# Patient Record
Sex: Female | Born: 1945
Health system: Southern US, Community
[De-identification: ages and names within clinical notes are randomized; demographics above are authoritative.]

## PROBLEM LIST (undated history)

## (undated) DIAGNOSIS — I10 Essential (primary) hypertension: Secondary | ICD-10-CM

## (undated) DIAGNOSIS — K219 Gastro-esophageal reflux disease without esophagitis: Secondary | ICD-10-CM

## (undated) DIAGNOSIS — M1990 Unspecified osteoarthritis, unspecified site: Secondary | ICD-10-CM

## (undated) DIAGNOSIS — E039 Hypothyroidism, unspecified: Secondary | ICD-10-CM

## (undated) DIAGNOSIS — C801 Malignant (primary) neoplasm, unspecified: Secondary | ICD-10-CM

## (undated) HISTORY — PX: ECTOPIC PREGNANCY SURGERY: SHX613

---

## 2017-08-18 ENCOUNTER — Observation Stay
Admission: EM | Admit: 2017-08-18 | Discharge: 2017-08-19 | Disposition: A | Discharge status: Home / Self Care | Payer: Medicare HMO | Attending: Internal Medicine | Admitting: Internal Medicine

## 2017-08-18 ENCOUNTER — Emergency Department: Payer: Medicare HMO

## 2017-08-18 DIAGNOSIS — N3000 Acute cystitis without hematuria: Secondary | ICD-10-CM | POA: Insufficient documentation

## 2017-08-18 DIAGNOSIS — I6782 Cerebral ischemia: Secondary | ICD-10-CM | POA: Diagnosis not present

## 2017-08-18 DIAGNOSIS — R42 Dizziness and giddiness: Secondary | ICD-10-CM | POA: Insufficient documentation

## 2017-08-18 DIAGNOSIS — I10 Essential (primary) hypertension: Secondary | ICD-10-CM | POA: Diagnosis not present

## 2017-08-18 DIAGNOSIS — E876 Hypokalemia: Secondary | ICD-10-CM | POA: Insufficient documentation

## 2017-08-18 DIAGNOSIS — F17210 Nicotine dependence, cigarettes, uncomplicated: Secondary | ICD-10-CM | POA: Insufficient documentation

## 2017-08-18 DIAGNOSIS — Z720 Tobacco use: Secondary | ICD-10-CM | POA: Diagnosis not present

## 2017-08-18 DIAGNOSIS — I161 Hypertensive emergency: Secondary | ICD-10-CM | POA: Diagnosis not present

## 2017-08-18 LAB — BASIC METABOLIC PANEL
ANION GAP: 10 (ref 5–15)
Anion gap: 10 (ref 5–15)
BUN: 12 mg/dL (ref 6–20)
BUN: 11 mg/dL (ref 6–20)
CALCIUM: 9.7 mg/dL (ref 8.9–10.3)
CALCIUM: 9.3 mg/dL (ref 8.9–10.3)
CHLORIDE: 102 mmol/L (ref 101–111)
CHLORIDE: 104 mmol/L (ref 101–111)
CO2: 25 mmol/L (ref 22–32)
CO2: 25 mmol/L (ref 22–32)
CREATININE: 0.97 mg/dL (ref 0.44–1.00)
Creatinine, Ser: 1.15 mg/dL — ABNORMAL HIGH (ref 0.44–1.00)
GFR calc non Af Amer: 46 mL/min — ABNORMAL LOW (ref >60)
GFR calc non Af Amer: 57 mL/min — ABNORMAL LOW (ref >60)
GFR, EST AFRICAN AMERICAN: 53 mL/min — AB (ref >60)
GLUCOSE: 90 mg/dL (ref 65–99)
Glucose, Bld: 151 mg/dL — ABNORMAL HIGH (ref 65–99)
Potassium: 3.7 mmol/L (ref 3.5–5.1)
Potassium: 3.4 mmol/L — ABNORMAL LOW (ref 3.5–5.1)
SODIUM: 137 mmol/L (ref 135–145)
Sodium: 139 mmol/L (ref 135–145)

## 2017-08-18 LAB — URINALYSIS, COMPLETE (UACMP) WITH MICROSCOPIC
BILIRUBIN URINE: NEGATIVE
GLUCOSE, UA: NEGATIVE mg/dL
KETONES UR: NEGATIVE mg/dL
Nitrite: NEGATIVE
PH: 7 (ref 5.0–8.0)
PROTEIN: NEGATIVE mg/dL
Specific Gravity, Urine: 1.004 — ABNORMAL LOW (ref 1.005–1.030)

## 2017-08-18 LAB — CBC
HCT: 37.8 % (ref 35.0–47.0)
Hemoglobin: 12.9 g/dL (ref 12.0–16.0)
MCH: 33.9 pg (ref 26.0–34.0)
MCHC: 34.1 g/dL (ref 32.0–36.0)
MCV: 99.3 fL (ref 80.0–100.0)
Platelets: 228 10*3/uL (ref 150–440)
RBC: 3.81 MIL/uL (ref 3.80–5.20)
RDW: 14.3 % (ref 11.5–14.5)
WBC: 6.8 10*3/uL (ref 3.6–11.0)

## 2017-08-18 LAB — TROPONIN I: Troponin I: <0.03 ng/mL (ref <0.03)

## 2017-08-18 MED ORDER — HYDROCHLOROTHIAZIDE 25 MG PO TABS
25.0000 mg | ORAL_TABLET | Freq: Every day | ORAL | Status: DC
  Administered 2017-08-18: 25 mg via ORAL
  Filled 2017-08-18 (×2): qty 1

## 2017-08-18 MED ORDER — HYDRALAZINE HCL 20 MG/ML IJ SOLN
5.0000 mg | Freq: Once | INTRAMUSCULAR | Status: AC
  Administered 2017-08-18: 5 mg via INTRAVENOUS
  Filled 2017-08-18: qty 1

## 2017-08-18 MED ORDER — SODIUM CHLORIDE 0.9 % IV BOLUS (SEPSIS)
500.0000 mL | Freq: Once | INTRAVENOUS | Status: AC
  Administered 2017-08-18: 500 mL via INTRAVENOUS

## 2017-08-18 MED ORDER — ENOXAPARIN SODIUM 40 MG/0.4ML ~~LOC~~ SOLN
40.0000 mg | SUBCUTANEOUS | Status: DC
  Administered 2017-08-18: 40 mg via SUBCUTANEOUS
  Filled 2017-08-18: qty 0.4

## 2017-08-18 MED ORDER — CEPHALEXIN 500 MG PO CAPS
500.0000 mg | ORAL_CAPSULE | Freq: Once | ORAL | Status: AC
  Administered 2017-08-18: 500 mg via ORAL
  Filled 2017-08-18: qty 1

## 2017-08-18 MED ORDER — ONDANSETRON HCL 4 MG PO TABS: 4.0000 mg | ORAL_TABLET | Freq: Four times a day (QID) | ORAL | Status: DC | PRN

## 2017-08-18 MED ORDER — ACETAMINOPHEN 325 MG PO TABS: 650.0000 mg | ORAL_TABLET | Freq: Four times a day (QID) | ORAL | Status: DC | PRN

## 2017-08-18 MED ORDER — LABETALOL HCL 5 MG/ML IV SOLN
10.0000 mg | INTRAVENOUS | Status: DC | PRN
  Administered 2017-08-18: 10 mg via INTRAVENOUS

## 2017-08-18 MED ORDER — LABETALOL HCL 5 MG/ML IV SOLN
INTRAVENOUS | Status: AC
  Filled 2017-08-18: qty 4

## 2017-08-18 MED ORDER — HYDRALAZINE HCL 20 MG/ML IJ SOLN: 10.0000 mg | Freq: Four times a day (QID) | INTRAMUSCULAR | Status: DC | PRN

## 2017-08-18 MED ORDER — NICOTINE 21 MG/24HR TD PT24
21.0000 mg | MEDICATED_PATCH | Freq: Every day | TRANSDERMAL | Status: DC
  Administered 2017-08-18: 21 mg via TRANSDERMAL
  Filled 2017-08-18 (×2): qty 1

## 2017-08-18 MED ORDER — LISINOPRIL 10 MG PO TABS
10.0000 mg | ORAL_TABLET | Freq: Every day | ORAL | Status: DC
  Filled 2017-08-18: qty 1

## 2017-08-18 MED ORDER — ACETAMINOPHEN 650 MG RE SUPP: 650.0000 mg | Freq: Four times a day (QID) | RECTAL | Status: DC | PRN

## 2017-08-18 MED ORDER — POTASSIUM CHLORIDE CRYS ER 20 MEQ PO TBCR
40.0000 meq | EXTENDED_RELEASE_TABLET | Freq: Once | ORAL | Status: AC
  Administered 2017-08-18: 40 meq via ORAL
  Filled 2017-08-18: qty 2

## 2017-08-18 MED ORDER — LISINOPRIL 10 MG PO TABS
10.0000 mg | ORAL_TABLET | Freq: Once | ORAL | Status: AC
  Administered 2017-08-18: 10 mg via ORAL
  Filled 2017-08-18: qty 1

## 2017-08-18 MED ORDER — CLONIDINE HCL 0.1 MG PO TABS
0.1000 mg | ORAL_TABLET | Freq: Two times a day (BID) | ORAL | Status: DC
  Administered 2017-08-18: 0.1 mg via ORAL
  Filled 2017-08-18: qty 1

## 2017-08-18 MED ORDER — ONDANSETRON HCL 4 MG/2ML IJ SOLN: 4.0000 mg | Freq: Four times a day (QID) | INTRAMUSCULAR | Status: DC | PRN

## 2017-08-18 NOTE — ED Provider Notes (Signed)
[facility] Emergency Department Provider Note  ____________________________________________  Time seen: Approximately 4:29 PM  I have reviewed the triage vital signs and the nursing notes.   HISTORY  Chief Complaint Hypertension and Dizziness   HPI Susan Medina is a 71 y.o. female no significant past medical history who presents for evaluation of dizziness. Patient has not seen a doctor in over 8 years. She denies having any medical problems. Since yesterday she's been having episodes of dizziness and feeling like she is going to pass out. These episodes happened both at rest, standing, and with exertion. They have happened several times a day for 2 days. No headache, no visual changes, no facial droop, no slurred speech, no difficulty finding words, no unilateral weakness or numbness, no chest pain or palpitations, no shortness of breath, no back pain or abdominal pain, no swelling of her lower extremities. She denies personal or family history of strokes or heart attacks.  History reviewed. No pertinent past medical history.  There are no active problems to display for this patient.   History reviewed. No pertinent surgical history.  Prior to Admission medications   Not on File    Allergies Patient has no known allergies.  No family history on file.  Social History Social History  Substance Use Topics  . Smoking status: Not on file  . Smokeless tobacco: Not on file  . Alcohol use Not on file    Review of Systems  Constitutional: Negative for fever. + near syncope Eyes: Negative for visual changes. ENT: Negative for sore throat. Neck: No neck pain  Cardiovascular: Negative for chest pain. Respiratory: Negative for shortness of breath. Gastrointestinal: Negative for abdominal pain, vomiting or diarrhea. Genitourinary: Negative for dysuria. Musculoskeletal: Negative for back pain. Skin: Negative for rash. Neurological: Negative for  headaches, weakness or numbness. Psych: No SI or HI  ____________________________________________   PHYSICAL EXAM:  VITAL SIGNS: ED Triage Vitals  Enc Vitals Group     BP 08/18/17 1210 (!) 175/107     Pulse Rate 08/18/17 1210 85     Resp 08/18/17 1210 16     Temp 08/18/17 1210 99.1 F (37.3 C)     Temp Source 08/18/17 1210 Oral     SpO2 08/18/17 1210 95 %     Weight 08/18/17 1209 160 lb (72.6 kg)     Height 08/18/17 1209 5\' 3"  (1.6 m)     Head Circumference --      Peak Flow --      Pain Score --      Pain Loc --      Pain Edu? --      Excl. in [place]? --     Constitutional: Alert and oriented. Well appearing and in no apparent distress. HEENT:      Head: Normocephalic and atraumatic.         Eyes: Conjunctivae are normal. Sclera is non-icteric.       Mouth/Throat: Mucous membranes are moist.       Neck: Supple with no signs of meningismus. Cardiovascular: Regular rate and rhythm. No murmurs, gallops, or rubs. 2+ symmetrical distal pulses are present in all extremities. No JVD. Respiratory: Normal respiratory effort. Lungs are clear to auscultation bilaterally. No wheezes, crackles, or rhonchi.  Gastrointestinal: Soft, non tender, and non distended with positive bowel sounds. No rebound or guarding. Musculoskeletal: Nontender with normal range of motion in all extremities. No edema, cyanosis, or erythema of extremities. Neurologic: Normal speech and language.  A & O x3, PERRL, no nystagmus, CN II-XII intact, motor testing reveals good tone and bulk throughout. There is no evidence of pronator drift or dysmetria. Muscle strength is 5/5 throughout. Sensory examination is intact. Gait is normal. Skin: Skin is warm, dry and intact. No rash noted. Psychiatric: Mood and affect are normal. Speech and behavior are normal.  ____________________________________________   LABS (all labs ordered are listed, but only abnormal results are displayed)  Labs Reviewed  BASIC METABOLIC PANEL  - Abnormal; Notable for the following:       Result Value   Glucose, Bld 151 (*)    Creatinine, Ser 1.15 (*)    GFR calc non Af Amer 46 (*)    GFR calc Af Amer 53 (*)    All other components within normal limits  URINALYSIS, COMPLETE (UACMP) WITH MICROSCOPIC - Abnormal; Notable for the following:    Color, Urine YELLOW (*)    APPearance HAZY (*)    Specific Gravity, Urine 1.004 (*)    Hgb urine dipstick SMALL (*)    Leukocytes, UA SMALL (*)    Bacteria, UA RARE (*)    Squamous Epithelial / LPF 0-5 (*)    All other components within normal limits  BASIC METABOLIC PANEL - Abnormal; Notable for the following:    Potassium 3.4 (*)    GFR calc non Af Amer 57 (*)    All other components within normal limits  URINE CULTURE  CBC  TROPONIN I  CBG MONITORING, ED   ____________________________________________  EKG  ED ECG REPORT I, Rudene Re, the attending physician, personally viewed and interpreted this ECG.  Normal sinus rhythm, rate of 74, normal intervals, normal axis, no ST elevations or depressions, T-wave inversions in lateral leads. no prior for comparison. ____________________________________________  RADIOLOGY  Head CT:  1. No acute intracranial process identified. 2. Moderate cerebral atrophy with chronic small vessel ischemic disease. ____________________________________________   PROCEDURES  Procedure(s) performed: None Procedures Critical Care performed:  None ____________________________________________   INITIAL IMPRESSION / ASSESSMENT AND PLAN / ED COURSE  71 y.o. female no significant past medical history who presents for evaluation of several episodes of near syncope over the course of the last 2 days. Patient is well-appearing, neurologically intact, has no chest pain or shortness of breath. She also has no headache. Her EKG shows T-wave inversions in lateral leads however there is no prior for comparison. Patient's vital signs show BP of  175/107. labs showing a creatinine of 1.15 with a GFR of 53. No prior labs available for comparison. Also slightly elevated glucose of 151 however this is not a fasting BMP. Troponin is negative. No evidence of anemia.head CT with no evidence of stroke or hemorrhage. We'll start patient on lisinopril in the emergency department for elevated blood pressure, we'll give IV fluids and recheck kidney function.    _________________________ 6:52 PM on 08/18/2017 -----------------------------------------  kidney function improved after IV fluids. Blood pressure remains elevated after 10 mg of by mouth lisinopril 5 mg of IV hydralazine. Patient continues to have episodes of lightheadedness with ambulation. CT head is negative. UA concerning for a urinary tract infection for which patient was started on Keflex. Due to concerns for hypertensive emergency patient will be admitted to the hospitalist service.   As part of my medical decision making, I reviewed the following data within the [place] History obtained from family, Nursing notes reviewed and incorporated, Labs reviewed , EKG interpreted , Discussed with admitting physician ,  Notes from prior ED visits and [place] Controlled Substance Database    Pertinent labs & imaging results that were available during my care of the patient were reviewed by me and considered in my medical decision making (see chart for details).    ____________________________________________   FINAL CLINICAL IMPRESSION(S) / ED DIAGNOSES  Final diagnoses:  Hypertensive emergency  Acute cystitis without hematuria      NEW MEDICATIONS STARTED DURING THIS VISIT:  New Prescriptions   No medications on file     Note:  This document was prepared using Dragon voice recognition software and may include unintentional dictation errors.    Rudene Re, MD 08/18/17 [phone]

## 2017-08-18 NOTE — H&P (Signed)
[facility] [place] NAME: Susan Medina    MR#:  [id number]  DATE OF BIRTH:  1946-08-30  DATE OF ADMISSION:  08/18/2017  PRIMARY CARE PHYSICIAN: Patient, No Pcp Per   REQUESTING/REFERRING PHYSICIAN: Dr. Rudene Re  CHIEF COMPLAINT:   Chief Complaint  Patient presents with  . Hypertension  . Dizziness    HISTORY OF PRESENT ILLNESS:  Susan Medina  is a 71 y.o. female with no known past medical history who presents to the hospital due to dizziness. Patient says that she's been feeling dizzy when trying to stand up over the past 2 days. She presented to the ER and was noted to be significantly hypertensive with systolic blood pressures greater than 409-811 and also diastolic greater than 914. Patient received some oral antihypertensives including lisinopril and HCTZ but her blood pressure has not improved. She remains symptomatic upon ambulation with significant dizziness. Her CT head is negative for any acute CVA. She also complains of some mild blurry vision. Given her accelerated hypertension and her being symptomatic with it hospitalist services were contacted further treatment and evaluation. Patient denies any chest pains, shortness of breath, nausea, vomiting, abdominal pain, fever, chills or any other associated symptoms presently.  PAST MEDICAL HISTORY:  History reviewed. No pertinent past medical history.  PAST SURGICAL HISTORY:  History reviewed. No pertinent surgical history.  SOCIAL HISTORY:   Social History  Substance Use Topics  . Smoking status: Current Every Day Smoker    Packs/day: 0.50    Years: 50.00    Types: Cigarettes  . Smokeless tobacco: Not on file  . Alcohol use No    FAMILY HISTORY:   Family History  Problem Relation Age of Onset  . Diabetes Mother     DRUG ALLERGIES:  No Known Allergies  REVIEW OF SYSTEMS:   Review of Systems  Constitutional: Negative for chills and fever.  HENT:  Negative for congestion and tinnitus.   Eyes: Positive for blurred vision. Negative for double vision.  Respiratory: Negative for cough, shortness of breath and wheezing.   Cardiovascular: Negative for chest pain, orthopnea and PND.  Gastrointestinal: Negative for abdominal pain, diarrhea, nausea and vomiting.  Genitourinary: Negative for dysuria and hematuria.  Neurological: Positive for dizziness. Negative for sensory change and focal weakness.  All other systems reviewed and are negative.   MEDICATIONS AT HOME:   Prior to Admission medications   Not on File      VITAL SIGNS:  Blood pressure (!) 200/98, pulse 72, temperature 99.1 F (37.3 C), temperature source Oral, resp. rate (!) 21, height 5\' 3"  (1.6 m), weight 72.6 kg (160 lb), SpO2 98 %.  PHYSICAL EXAMINATION:  Physical Exam  GENERAL:  71 y.o.-year-old patient lying in bed in no acute distress.  EYES: Pupils equal, round, reactive to light and accommodation. No scleral icterus. Extraocular muscles intact.  HEENT: Head atraumatic, normocephalic. Oropharynx and nasopharynx clear. No oropharyngeal erythema, moist oral mucosa  NECK:  Supple, no jugular venous distention. No thyroid enlargement, no tenderness.  LUNGS: Normal breath sounds bilaterally, no wheezing, rales, rhonchi. No use of accessory muscles of respiration.  CARDIOVASCULAR: S1, S2 RRR. No murmurs, rubs, gallops, clicks.  ABDOMEN: Soft, nontender, nondistended. Bowel sounds present. No organomegaly or mass.  EXTREMITIES: No pedal edema, cyanosis, or clubbing. + 2 pedal & radial pulses b/l.   NEUROLOGIC: Cranial nerves II through XII are intact. No focal Motor or sensory deficits appreciated b/l PSYCHIATRIC: The patient is alert  and oriented x 3.   SKIN: No obvious rash, lesion, or ulcer.   LABORATORY PANEL:   CBC  Recent Labs Lab 08/18/17 1216  WBC 6.8  HGB 12.9  HCT 37.8  PLT 228    ------------------------------------------------------------------------------------------------------------------  Chemistries   Recent Labs Lab 08/18/17 1717  NA 139  K 3.4*  CL 104  CO2 25  GLUCOSE 90  BUN 11  CREATININE 0.97  CALCIUM 9.3   ------------------------------------------------------------------------------------------------------------------  Cardiac Enzymes  Recent Labs Lab 08/18/17 1215  TROPONINI <0.03   ------------------------------------------------------------------------------------------------------------------  RADIOLOGY:  Ct Head Wo Contrast  Result Date: 08/18/2017 CLINICAL DATA:  Initial evaluation for persistent vertigo. EXAM: CT HEAD WITHOUT CONTRAST TECHNIQUE: Contiguous axial images were obtained from the base of the skull through the vertex without intravenous contrast. COMPARISON:  None available. FINDINGS: Brain: Moderate cerebral atrophy with chronic small vessel ischemic disease. No acute intracranial hemorrhage. No evidence for acute large vessel territory infarct. No mass lesion, midline shift or mass effect. No hydrocephalus. No extra-axial fluid collection. Vascular: No hyperdense vessel. Scattered vascular calcifications noted within the carotid siphons. Skull: Scalp soft tissues and calvarium within normal limits. Sinuses/Orbits: Globes normal soft tissues unremarkable. Visualized paranasal sinuses are clear. Visualize mastoids are well pneumatized and clear. Other: None. IMPRESSION: 1. No acute intracranial process identified. 2. Moderate cerebral atrophy with chronic small vessel ischemic disease. Electronically Signed   By: Jeannine Boga M.D.   On: 08/18/2017 16:14     IMPRESSION AND PLAN:   71 year old female with past medical history of tobacco abuse who presents to the hospital due to dizziness and blurry vision and noted to have accelerated hypertension.  1. Malignant/accelerated hypertension - the cause of patient's  blurry vision, dizziness. -CT head is negative for acute CVA. I will place the patient on some oral lisinopril, HCTZ and also clonidine. -I will also add some IV hydralazine, labetalol. Follow blood pressure.  2. Tobacco abuse-we'll place on nicotine patch.  3. Hypokalemia-we'll give oral potassium supplements and repeat level in the morning.    All the records are reviewed and case discussed with ED provider. Management plans discussed with the patient, family and they are in agreement.  CODE STATUS: Full code  TOTAL TIME TAKING CARE OF THIS PATIENT: 40 minutes.    Henreitta Leber M.D on 08/18/2017 at 7:13 PM  Between 7am to 6pm - Pager - [phone]  After 6pm go to [email or web address] - password [email or web address] [facility]  Office  [phone]  CC: Primary care physician; Patient, No Pcp Per

## 2017-08-18 NOTE — ED Triage Notes (Signed)
Pt came to ED via pov c/o near syncopal episodes, reports believes blood pressure is elevated, has never been diagnosed with htn. BP 176/108 in triage.

## 2017-08-19 DIAGNOSIS — E876 Hypokalemia: Secondary | ICD-10-CM | POA: Diagnosis not present

## 2017-08-19 DIAGNOSIS — Z720 Tobacco use: Secondary | ICD-10-CM | POA: Diagnosis not present

## 2017-08-19 DIAGNOSIS — I10 Essential (primary) hypertension: Secondary | ICD-10-CM | POA: Diagnosis not present

## 2017-08-19 LAB — CBC
HCT: 34.5 % — ABNORMAL LOW (ref 35.0–47.0)
Hemoglobin: 11.9 g/dL — ABNORMAL LOW (ref 12.0–16.0)
MCH: 34.2 pg — ABNORMAL HIGH (ref 26.0–34.0)
MCHC: 34.6 g/dL (ref 32.0–36.0)
MCV: 99.1 fL (ref 80.0–100.0)
PLATELETS: 200 10*3/uL (ref 150–440)
RBC: 3.48 MIL/uL — AB (ref 3.80–5.20)
RDW: 14.1 % (ref 11.5–14.5)
WBC: 5.4 10*3/uL (ref 3.6–11.0)

## 2017-08-19 LAB — BASIC METABOLIC PANEL
Anion gap: 10 (ref 5–15)
BUN: 13 mg/dL (ref 6–20)
CALCIUM: 9.5 mg/dL (ref 8.9–10.3)
CO2: 23 mmol/L (ref 22–32)
CREATININE: 1.22 mg/dL — AB (ref 0.44–1.00)
Chloride: 105 mmol/L (ref 101–111)
GFR calc non Af Amer: 43 mL/min — ABNORMAL LOW (ref >60)
GFR, EST AFRICAN AMERICAN: 50 mL/min — AB (ref >60)
Glucose, Bld: 88 mg/dL (ref 65–99)
Potassium: 4 mmol/L (ref 3.5–5.1)
SODIUM: 138 mmol/L (ref 135–145)

## 2017-08-19 MED ORDER — LISINOPRIL 10 MG PO TABS: 10.0000 mg | ORAL_TABLET | Freq: Every day | ORAL | 0 refills | Status: AC

## 2017-08-19 MED ORDER — ASPIRIN 81 MG PO TBEC: 81.0000 mg | DELAYED_RELEASE_TABLET | Freq: Every day | ORAL | 0 refills | Status: AC

## 2017-08-19 MED ORDER — SODIUM CHLORIDE 0.9 % IV BOLUS (SEPSIS): 250.0000 mL | Freq: Once | INTRAVENOUS | Status: DC

## 2017-08-19 MED ORDER — HYDROCHLOROTHIAZIDE 25 MG PO TABS: 25.0000 mg | ORAL_TABLET | Freq: Every day | ORAL | 1 refills | Status: AC

## 2017-08-19 MED ORDER — ASPIRIN EC 81 MG PO TBEC
81.0000 mg | DELAYED_RELEASE_TABLET | Freq: Every day | ORAL | Status: DC
  Administered 2017-08-19: 81 mg via ORAL
  Filled 2017-08-19: qty 1

## 2017-08-19 NOTE — Discharge Instructions (Signed)
Pt instructed to get PCP in the area  Keep log of blood pressure at home.   Pt will need to pick up BP monitor from pharmacy.  Blood pressure meds should not be taken if TOP NUMBER is below 120  How to Take Your Blood Pressure Blood pressure is a measurement of how strongly your blood is pressing against the walls of your arteries. Arteries are blood vessels that carry blood from your heart throughout your body. Your health care provider takes your blood pressure at each office visit. You can also take your own blood pressure at home with a blood pressure machine. You may need to take your own blood pressure:  To confirm a diagnosis of high blood pressure (hypertension).  To monitor your blood pressure over time.  To make sure your blood pressure medicine is working.  Supplies needed: To take your blood pressure, you will need a blood pressure machine. You can buy a blood pressure machine, or blood pressure monitor, at most drugstores or online. There are several types of home blood pressure monitors. When choosing one, consider the following:  Choose a monitor that has an arm cuff.  Choose a monitor that wraps snugly around your upper arm. You should be able to fit only one finger between your arm and the cuff.  Do not choose a monitor that measures your blood pressure from your wrist or finger.  Your health care provider can suggest a reliable monitor that will meet your needs. How to prepare To get the most accurate reading, avoid the following for 30 minutes before you check your blood pressure:  Drinking caffeine.  Drinking alcohol.  Eating.  Smoking.  Exercising.  Five minutes before you check your blood pressure:  Empty your bladder.  Sit quietly without talking in a dining chair, rather than in a soft couch or armchair.  How to take your blood pressure To check your blood pressure, follow the instructions in the manual that came with your blood pressure monitor.  If you have a digital blood pressure monitor, the instructions may be as follows: 1. Sit up straight. 2. Place your feet on the floor. Do not cross your ankles or legs. 3. Rest your left arm at the level of your heart on a table or desk or on the arm of a chair. 4. Pull up your shirt sleeve. 5. Wrap the blood pressure cuff around the upper part of your left arm, 1 inch (2.5 cm) above your elbow. It is best to wrap the cuff around bare skin. 6. Fit the cuff snugly around your arm. You should be able to place only one finger between the cuff and your arm. 7. Position the cord inside the groove of your elbow. 8. Press the power button. 9. Sit quietly while the cuff inflates and deflates. 10. Read the digital reading on the monitor screen and write it down (record it). 11. Wait 2-3 minutes, then repeat the steps, starting at step 1.  What does my blood pressure reading mean? A blood pressure reading consists of a higher number over a lower number. Ideally, your blood pressure should be below 120/80. The first ("top") number is called the systolic pressure. It is a measure of the pressure in your arteries as your heart beats. The second ("bottom") number is called the diastolic pressure. It is a measure of the pressure in your arteries as the heart relaxes. Blood pressure is classified into four stages. The following are the stages for adults  who do not have a short-term serious illness or a chronic condition. Systolic pressure and diastolic pressure are measured in a unit called mm Hg. Normal  Systolic pressure: below 818.  Diastolic pressure: below 80. Elevated  Systolic pressure: 590-931.  Diastolic pressure: below 80. Hypertension stage 1  Systolic pressure: 121-624.  Diastolic pressure: 46-95. Hypertension stage 2  Systolic pressure: 072 or above.  Diastolic pressure: 90 or above. You can have prehypertension or hypertension even if only the systolic or only the diastolic number  in your reading is higher than normal. Follow these instructions at home:  Check your blood pressure as often as recommended by your health care provider.  Take your monitor to the next appointment with your health care provider to make sure: ? That you are using it correctly. ? That it provides accurate readings.  Be sure you understand what your goal blood pressure numbers are.  Tell your health care provider if you are having any side effects from blood pressure medicine. Contact a health care provider if:  Your blood pressure is consistently high. Get help right away if:  Your systolic blood pressure is higher than 180.  Your diastolic blood pressure is higher than 110. This information is not intended to replace advice given to you by your health care provider. Make sure you discuss any questions you have with your health care provider. Document Released: 03/24/2016 Document Revised: 06/06/2016 Document Reviewed: 03/24/2016 Elsevier Interactive Patient Education  [employer].  or if BOTTOM NUMBER is below 60.

## 2017-08-19 NOTE — Discharge Summary (Signed)
[facility] NAME: Susan Medina    MR#:  [id number]  DATE OF BIRTH:  24-Jul-1946  DATE OF ADMISSION:  08/18/2017 ADMITTING PHYSICIAN: Henreitta Leber, MD  DATE OF DISCHARGE: 08/19/17  PRIMARY CARE PHYSICIAN: Patient, No Pcp Per    ADMISSION DIAGNOSIS:  Acute cystitis without hematuria [N30.00] Hypertensive emergency [I16.1]  DISCHARGE DIAGNOSIS:  Malignant HTN-new Tobacco abuse  SECONDARY DIAGNOSIS:  History reviewed. No pertinent past medical history.  HOSPITAL COURSE:   71 year old female with past medical history of tobacco abuse who presents to the hospital due to dizziness and blurry vision and noted to have accelerated hypertension.  1. Malignant/accelerated hypertension - the cause of patient's blurry vision, dizziness. -CT head is negative for acute CVA. -bp better on  oral lisinopril, HCTZ .  IV hydralazine prn -ASA 81 mg  2. Tobacco abuse-we'll place on nicotine patch.  3. Hypokalemia- repleted  Overall improved Advised to keep log of BP at home D/c home alter today Pt advised to get PCP in the area  CONSULTS OBTAINED:    DRUG ALLERGIES:  No Known Allergies  DISCHARGE MEDICATIONS:   Current Discharge Medication List    START taking these medications   Details  aspirin EC 81 MG EC tablet Take 1 tablet (81 mg total) by mouth daily. Qty: 30 tablet, Refills: 0    hydrochlorothiazide (HYDRODIURIL) 25 MG tablet Take 1 tablet (25 mg total) by mouth daily. Qty: 30 tablet, Refills: 1    lisinopril (PRINIVIL,ZESTRIL) 10 MG tablet Take 1 tablet (10 mg total) by mouth daily. Qty: 30 tablet, Refills: 0        If you experience worsening of your admission symptoms, develop shortness of breath, life threatening emergency, suicidal or homicidal thoughts you must seek medical attention immediately by calling 911 or calling your MD immediately  if symptoms less severe.  You Must read complete  instructions/literature along with all the possible adverse reactions/side effects for all the Medicines you take and that have been prescribed to you. Take any new Medicines after you have completely understood and accept all the possible adverse reactions/side effects.   Please note  You were cared for by a hospitalist during your hospital stay. If you have any questions about your discharge medications or the care you received while you were in the hospital after you are discharged, you can call the unit and asked to speak with the hospitalist on call if the hospitalist that took care of you is not available. Once you are discharged, your primary care physician will handle any further medical issues. Please note that NO REFILLS for any discharge medications will be authorized once you are discharged, as it is imperative that you return to your primary care physician (or establish a relationship with a primary care physician if you do not have one) for your aftercare needs so that they can reassess your need for medications and monitor your lab values. Today   SUBJECTIVE   Feels better  VITAL SIGNS:  Blood pressure (!) 163/89, pulse 60, temperature 98 F (36.7 C), temperature source Oral, resp. rate 16, height 5\' 3"  (1.6 m), weight 72.6 kg (160 lb), SpO2 97 %.  I/O:   Intake/Output Summary (Last 24 hours) at 08/19/17 0838 Last data filed at 08/19/17 0648  Gross per 24 hour  Intake                0 ml  Output  200 ml  Net             -200 ml    PHYSICAL EXAMINATION:  GENERAL:  71 y.o.-year-old patient lying in the bed with no acute distress.  EYES: Pupils equal, round, reactive to light and accommodation. No scleral icterus. Extraocular muscles intact.  HEENT: Head atraumatic, normocephalic. Oropharynx and nasopharynx clear.  NECK:  Supple, no jugular venous distention. No thyroid enlargement, no tenderness.  LUNGS: Normal breath sounds bilaterally, no wheezing,  rales,rhonchi or crepitation. No use of accessory muscles of respiration.  CARDIOVASCULAR: S1, S2 normal. No murmurs, rubs, or gallops.  ABDOMEN: Soft, non-tender, non-distended. Bowel sounds present. No organomegaly or mass.  EXTREMITIES: No pedal edema, cyanosis, or clubbing.  NEUROLOGIC: Cranial nerves II through XII are intact. Muscle strength 5/5 in all extremities. Sensation intact. Gait not checked.  PSYCHIATRIC: The patient is alert and oriented x 3.  SKIN: No obvious rash, lesion, or ulcer.   DATA REVIEW:   CBC   Recent Labs Lab 08/19/17 0358  WBC 5.4  HGB 11.9*  HCT 34.5*  PLT 200    Chemistries   Recent Labs Lab 08/19/17 0358  NA 138  K 4.0  CL 105  CO2 23  GLUCOSE 88  BUN 13  CREATININE 1.22*  CALCIUM 9.5    Microbiology Results   No results found for this or any previous visit (from the past 240 hour(s)).  RADIOLOGY:  Ct Head Wo Contrast  Result Date: 08/18/2017 CLINICAL DATA:  Initial evaluation for persistent vertigo. EXAM: CT HEAD WITHOUT CONTRAST TECHNIQUE: Contiguous axial images were obtained from the base of the skull through the vertex without intravenous contrast. COMPARISON:  None available. FINDINGS: Brain: Moderate cerebral atrophy with chronic small vessel ischemic disease. No acute intracranial hemorrhage. No evidence for acute large vessel territory infarct. No mass lesion, midline shift or mass effect. No hydrocephalus. No extra-axial fluid collection. Vascular: No hyperdense vessel. Scattered vascular calcifications noted within the carotid siphons. Skull: Scalp soft tissues and calvarium within normal limits. Sinuses/Orbits: Globes normal soft tissues unremarkable. Visualized paranasal sinuses are clear. Visualize mastoids are well pneumatized and clear. Other: None. IMPRESSION: 1. No acute intracranial process identified. 2. Moderate cerebral atrophy with chronic small vessel ischemic disease. Electronically Signed   By: Jeannine Boga  M.D.   On: 08/18/2017 16:14     Management plans discussed with the patient, family and they are in agreement.  CODE STATUS:     Code Status Orders        Start     Ordered   08/18/17 1941  Full code  Continuous     08/18/17 1940    Code Status History    Date Active Date Inactive Code Status Order ID Comments User Context   This patient has a current code status but no historical code status.      TOTAL TIME TAKING CARE OF THIS PATIENT: 40 minutes.    Jennetta Flood M.D on 08/19/2017 at 8:38 AM  Between 7am to 6pm - Pager - [phone] After 6pm go to [email or web address] - password [email or web address] [facility]  Office  [phone]  CC: Primary care physician; Patient, No Pcp Per

## 2017-08-19 NOTE — Care Management CC44 (Signed)
Condition Code 44 Documentation Completed  Patient Details  Name: EDIT RICCIARDELLI MRN: [id number] Date of Birth: November 19, 1945   Condition Code 44 given:  Yes Patient signature on Condition Code 44 notice:  Yes Documentation of 2 MD's agreement:  Yes Code 44 added to claim:  Yes    Kobie Whidby A, RN 08/19/2017, 2:08 PM

## 2017-08-19 NOTE — Care Management Obs Status (Signed)
[facility] NOTIFICATION   Patient Details  Name: Susan Medina MRN: [id number] Date of Birth: May 12, 1946   Medicare Observation Status Notification Given:  Yes    Devona Holmes A, RN 08/19/2017, 2:08 PM

## 2017-08-21 LAB — URINE CULTURE: Culture: >=100000 — AB

## 2017-08-24 DIAGNOSIS — I10 Essential (primary) hypertension: Secondary | ICD-10-CM | POA: Diagnosis not present

## 2017-08-24 DIAGNOSIS — R300 Dysuria: Secondary | ICD-10-CM | POA: Diagnosis not present

## 2017-09-18 DIAGNOSIS — M545 Low back pain, unspecified: Secondary | ICD-10-CM | POA: Insufficient documentation

## 2017-09-18 DIAGNOSIS — Z13220 Encounter for screening for lipoid disorders: Secondary | ICD-10-CM | POA: Diagnosis not present

## 2017-09-18 DIAGNOSIS — G8929 Other chronic pain: Secondary | ICD-10-CM | POA: Insufficient documentation

## 2017-09-18 DIAGNOSIS — Z5181 Encounter for therapeutic drug level monitoring: Secondary | ICD-10-CM | POA: Diagnosis not present

## 2017-09-18 DIAGNOSIS — I10 Essential (primary) hypertension: Secondary | ICD-10-CM | POA: Diagnosis not present

## 2017-09-18 DIAGNOSIS — Z8639 Personal history of other endocrine, nutritional and metabolic disease: Secondary | ICD-10-CM | POA: Diagnosis not present

## 2017-10-12 ENCOUNTER — Emergency Department: Payer: Medicare HMO

## 2017-10-12 ENCOUNTER — Encounter: Payer: Self-pay | Admitting: Emergency Medicine

## 2017-10-12 ENCOUNTER — Emergency Department
Admission: EM | Admit: 2017-10-12 | Discharge: 2017-10-12 | Disposition: A | Discharge status: Home / Self Care | Payer: Medicare HMO | Attending: Student in an Organized Health Care Education/Training Program | Admitting: Student in an Organized Health Care Education/Training Program

## 2017-10-12 ENCOUNTER — Other Ambulatory Visit: Payer: Self-pay

## 2017-10-12 DIAGNOSIS — F17210 Nicotine dependence, cigarettes, uncomplicated: Secondary | ICD-10-CM | POA: Insufficient documentation

## 2017-10-12 DIAGNOSIS — Z79899 Other long term (current) drug therapy: Secondary | ICD-10-CM | POA: Diagnosis not present

## 2017-10-12 DIAGNOSIS — I10 Essential (primary) hypertension: Secondary | ICD-10-CM | POA: Insufficient documentation

## 2017-10-12 DIAGNOSIS — Z7982 Long term (current) use of aspirin: Secondary | ICD-10-CM | POA: Diagnosis not present

## 2017-10-12 DIAGNOSIS — R531 Weakness: Secondary | ICD-10-CM | POA: Insufficient documentation

## 2017-10-12 DIAGNOSIS — R112 Nausea with vomiting, unspecified: Secondary | ICD-10-CM | POA: Diagnosis not present

## 2017-10-12 DIAGNOSIS — N3000 Acute cystitis without hematuria: Secondary | ICD-10-CM | POA: Diagnosis not present

## 2017-10-12 DIAGNOSIS — W19XXXA Unspecified fall, initial encounter: Secondary | ICD-10-CM | POA: Diagnosis not present

## 2017-10-12 DIAGNOSIS — R05 Cough: Secondary | ICD-10-CM | POA: Diagnosis not present

## 2017-10-12 DIAGNOSIS — S0990XA Unspecified injury of head, initial encounter: Secondary | ICD-10-CM | POA: Diagnosis not present

## 2017-10-12 DIAGNOSIS — R42 Dizziness and giddiness: Secondary | ICD-10-CM | POA: Diagnosis not present

## 2017-10-12 HISTORY — DX: Essential (primary) hypertension: I10

## 2017-10-12 LAB — CBC WITH DIFFERENTIAL/PLATELET
BASOS PCT: 1 %
Basophils Absolute: 0.1 10*3/uL (ref 0–0.1)
Eosinophils Absolute: 1.1 10*3/uL — ABNORMAL HIGH (ref 0–0.7)
Eosinophils Relative: 15 %
HEMATOCRIT: 35.6 % (ref 35.0–47.0)
Hemoglobin: 12 g/dL (ref 12.0–16.0)
LYMPHS ABS: 2.1 10*3/uL (ref 1.0–3.6)
LYMPHS PCT: 27 %
MCH: 33 pg (ref 26.0–34.0)
MCHC: 33.6 g/dL (ref 32.0–36.0)
MCV: 98.2 fL (ref 80.0–100.0)
MONOS PCT: 6 %
Monocytes Absolute: 0.5 10*3/uL (ref 0.2–0.9)
NEUTROS PCT: 51 %
Neutro Abs: 4 10*3/uL (ref 1.4–6.5)
Platelets: 296 10*3/uL (ref 150–440)
RBC: 3.62 MIL/uL — ABNORMAL LOW (ref 3.80–5.20)
RDW: 14.1 % (ref 11.5–14.5)
WBC: 7.8 10*3/uL (ref 3.6–11.0)

## 2017-10-12 LAB — COMPREHENSIVE METABOLIC PANEL
ALBUMIN: 3.9 g/dL (ref 3.5–5.0)
ALT: 30 U/L (ref 14–54)
ANION GAP: 10 (ref 5–15)
AST: 43 U/L — ABNORMAL HIGH (ref 15–41)
Alkaline Phosphatase: 90 U/L (ref 38–126)
BUN: 33 mg/dL — AB (ref 6–20)
CALCIUM: 9.6 mg/dL (ref 8.9–10.3)
CO2: 23 mmol/L (ref 22–32)
Chloride: 103 mmol/L (ref 101–111)
Creatinine, Ser: 1.18 mg/dL — ABNORMAL HIGH (ref 0.44–1.00)
GFR calc Af Amer: 52 mL/min — ABNORMAL LOW (ref >60)
GFR calc non Af Amer: 45 mL/min — ABNORMAL LOW (ref >60)
GLUCOSE: 111 mg/dL — AB (ref 65–99)
Potassium: 3.9 mmol/L (ref 3.5–5.1)
Sodium: 136 mmol/L (ref 135–145)
TOTAL PROTEIN: 8.3 g/dL — AB (ref 6.5–8.1)
Total Bilirubin: 0.5 mg/dL (ref 0.3–1.2)

## 2017-10-12 LAB — URINALYSIS, COMPLETE (UACMP) WITH MICROSCOPIC
BILIRUBIN URINE: NEGATIVE
Glucose, UA: NEGATIVE mg/dL
Ketones, ur: NEGATIVE mg/dL
Nitrite: POSITIVE — AB
PH: 6 (ref 5.0–8.0)
Protein, ur: NEGATIVE mg/dL
SPECIFIC GRAVITY, URINE: 1.009 (ref 1.005–1.030)

## 2017-10-12 LAB — TROPONIN I: Troponin I: <0.03 ng/mL (ref <0.03)

## 2017-10-12 MED ORDER — DEXTROSE 5 % IV SOLN
2.0000 g | Freq: Once | INTRAVENOUS | Status: AC
  Administered 2017-10-12: 2 g via INTRAVENOUS
  Filled 2017-10-12: qty 2

## 2017-10-12 MED ORDER — ACETAMINOPHEN 500 MG PO TABS
1000.0000 mg | ORAL_TABLET | Freq: Once | ORAL | Status: AC
  Administered 2017-10-12: 1000 mg via ORAL
  Filled 2017-10-12: qty 2

## 2017-10-12 MED ORDER — ONDANSETRON HCL 4 MG PO TABS: 4.0000 mg | ORAL_TABLET | Freq: Every day | ORAL | 0 refills | Status: AC | PRN

## 2017-10-12 MED ORDER — SODIUM CHLORIDE 0.9 % IV BOLUS (SEPSIS)
500.0000 mL | Freq: Once | INTRAVENOUS | Status: AC
  Administered 2017-10-12: 500 mL via INTRAVENOUS

## 2017-10-12 MED ORDER — CEPHALEXIN 500 MG PO CAPS: 500.0000 mg | ORAL_CAPSULE | Freq: Three times a day (TID) | ORAL | 0 refills | Status: AC

## 2017-10-12 NOTE — ED Notes (Signed)
First Nurse Note:  Complaining of "light-headedness, I can't hardly stand up, like I'm going to pass out".  States she fell yesterday.  Seated in WC.  Alert and oriented.

## 2017-10-12 NOTE — ED Provider Notes (Signed)
[facility] Emergency Department Provider Note    First MD Initiated Contact with Patient 10/12/17 [phone]     (approximate)  I have reviewed the triage vital signs and the nursing notes.   HISTORY  Chief Complaint Dizziness    HPI Susan Medina is a 71 y.o. female history of hypertension and recent hospitalization for hypertensive urgency and UTI presents to the ER for 3 days of lightheadedness particularly when standing frontal headache and nonproductive cough.  No fevers at home.  States that she was feeling weak walking down the hallway and hit her head against the hall on Thursday.  Denies any loss of consciousness.  No numbness or tingling.  Patient states is also been having frequent episodes of nausea and vomiting but denies any abdominal pain.  Her sister came to stay with her last night she did well overnight but then woke up this morning and still felt lightheaded so she was brought to the ER.  She denies any discomfort at this time.  No chest pain or palpitations.  She has been compliant with her medications.  Sister is complaining and concerned about mold poisoning.  Past Medical History:  Diagnosis Date  . Hypertension    Family History  Problem Relation Age of Onset  . Diabetes Mother    History reviewed. No pertinent surgical history. Patient Active Problem List   Diagnosis Date Noted  . Accelerated hypertension 08/18/2017      Prior to Admission medications   Medication Sig Start Date End Date Taking? Authorizing Provider  levothyroxine (SYNTHROID, LEVOTHROID) 88 MCG tablet Take 1 tablet by mouth daily. 09/19/17 09/19/18 Yes [provider]  aspirin EC 81 MG EC tablet Take 1 tablet (81 mg total) by mouth daily. 08/19/17   Fritzi Mandes, MD  cephALEXin (KEFLEX) 500 MG capsule Take 1 capsule (500 mg total) by mouth 3 (three) times daily for 7 days. 10/12/17 10/19/17  Merlyn Lot, MD  hydrochlorothiazide (HYDRODIURIL) 25 MG  tablet Take 1 tablet (25 mg total) by mouth daily. 08/19/17   Fritzi Mandes, MD  lisinopril (PRINIVIL,ZESTRIL) 10 MG tablet Take 1 tablet (10 mg total) by mouth daily. 08/19/17   Fritzi Mandes, MD  ondansetron (ZOFRAN) 4 MG tablet Take 1 tablet (4 mg total) by mouth daily as needed for nausea or vomiting. 10/12/17 10/12/18  Merlyn Lot, MD    Allergies Patient has no known allergies.    Social History Social History   Tobacco Use  . Smoking status: Current Every Day Smoker    Packs/day: 0.50    Years: 50.00    Pack years: 25.00    Types: Cigarettes  . Smokeless tobacco: Never Used  Substance Use Topics  . Alcohol use: No  . Drug use: Yes    Types: Marijuana    Comment: smoked it yesterday.      Review of Systems Patient denies headaches, rhinorrhea, blurry vision, numbness, shortness of breath, chest pain, edema, cough, abdominal pain, nausea, vomiting, diarrhea, dysuria, fevers, rashes or hallucinations unless otherwise stated above in HPI. ____________________________________________   PHYSICAL EXAM:  VITAL SIGNS: Vitals:   10/12/17 1200 10/12/17 1230  BP: 131/89 122/82  Pulse:  72  Resp:  17  Temp:    SpO2:  98%    Constitutional: Alert and oriented. Well appearing and in no acute distress. Eyes: Conjunctivae are normal.  Head: Atraumatic. Nose: No congestion/rhinnorhea. Mouth/Throat: Mucous membranes are moist.   Neck: No stridor. Painless ROM.  Cardiovascular: Normal rate,  regular rhythm. Grossly normal heart sounds.  Good peripheral circulation. Respiratory: Normal respiratory effort.  No retractions. Lungs CTAB. Gastrointestinal: Soft and nontender. No distention. No abdominal bruits. No CVA tenderness. Genitourinary:  Musculoskeletal: No lower extremity tenderness nor edema.  No joint effusions. Neurologic:  CN- intact.  No facial droop, Normal FNF.  Normal heel to shin.  Sensation intact bilaterally. Normal speech and language. No gross focal  neurologic deficits are appreciated. No gait instability.  Skin:  Skin is warm, dry and intact. No rash noted. Psychiatric: Mood and affect are normal. Speech and behavior are normal.  ____________________________________________   LABS (all labs ordered are listed, but only abnormal results are displayed)  Results for orders placed or performed during the hospital encounter of 10/12/17 (from the past 24 hour(s))  CBC with Differential/Platelet     Status: Abnormal   Collection Time: 10/12/17  8:40 AM  Result Value Ref Range   WBC 7.8 3.6 - 11.0 K/uL   RBC 3.62 (L) 3.80 - 5.20 MIL/uL   Hemoglobin 12.0 12.0 - 16.0 g/dL   HCT 35.6 35.0 - 47.0 %   MCV 98.2 80.0 - 100.0 fL   MCH 33.0 26.0 - 34.0 pg   MCHC 33.6 32.0 - 36.0 g/dL   RDW 14.1 11.5 - 14.5 %   Platelets 296 150 - 440 K/uL   Neutrophils Relative % 51 %   Neutro Abs 4.0 1.4 - 6.5 K/uL   Lymphocytes Relative 27 %   Lymphs Abs 2.1 1.0 - 3.6 K/uL   Monocytes Relative 6 %   Monocytes Absolute 0.5 0.2 - 0.9 K/uL   Eosinophils Relative 15 %   Eosinophils Absolute 1.1 (H) 0 - 0.7 K/uL   Basophils Relative 1 %   Basophils Absolute 0.1 0 - 0.1 K/uL  Comprehensive metabolic panel     Status: Abnormal   Collection Time: 10/12/17  8:40 AM  Result Value Ref Range   Sodium 136 135 - 145 mmol/L   Potassium 3.9 3.5 - 5.1 mmol/L   Chloride 103 101 - 111 mmol/L   CO2 23 22 - 32 mmol/L   Glucose, Bld 111 (H) 65 - 99 mg/dL   BUN 33 (H) 6 - 20 mg/dL   Creatinine, Ser 1.18 (H) 0.44 - 1.00 mg/dL   Calcium 9.6 8.9 - 10.3 mg/dL   Total Protein 8.3 (H) 6.5 - 8.1 g/dL   Albumin 3.9 3.5 - 5.0 g/dL   AST 43 (H) 15 - 41 U/L   ALT 30 14 - 54 U/L   Alkaline Phosphatase 90 38 - 126 U/L   Total Bilirubin 0.5 0.3 - 1.2 mg/dL   GFR calc non Af Amer 45 (L) >60 mL/min   GFR calc Af Amer 52 (L) >60 mL/min   Anion gap 10 5 - 15  Troponin I     Status: None   Collection Time: 10/12/17  8:40 AM  Result Value Ref Range   Troponin I <0.03 <0.03 ng/mL   Urinalysis, Complete w Microscopic     Status: Abnormal   Collection Time: 10/12/17 10:00 AM  Result Value Ref Range   Color, Urine YELLOW (A) YELLOW   APPearance HAZY (A) CLEAR   Specific Gravity, Urine 1.009 1.005 - 1.030   pH 6.0 5.0 - 8.0   Glucose, UA NEGATIVE NEGATIVE mg/dL   Hgb urine dipstick SMALL (A) NEGATIVE   Bilirubin Urine NEGATIVE NEGATIVE   Ketones, ur NEGATIVE NEGATIVE mg/dL   Protein, ur NEGATIVE NEGATIVE mg/dL  Nitrite POSITIVE (A) NEGATIVE   Leukocytes, UA MODERATE (A) NEGATIVE   RBC / HPF 0-5 0 - 5 RBC/hpf   WBC, UA TOO NUMEROUS TO COUNT 0 - 5 WBC/hpf   Bacteria, UA MANY (A) NONE SEEN   Squamous Epithelial / LPF 0-5 (A) NONE SEEN   WBC Clumps PRESENT    Mucus PRESENT    ____________________________________________  EKG My review and personal interpretation at Time: 12:15   Indication: lightheadedness  Rate: 75  Rhythm: sinus Axis: normal Other: poor rwave progression with inferolateral st and t wave changes unchanged from previous 10/20 ____________________________________________  RADIOLOGY  I personally reviewed all radiographic images ordered to evaluate for the above acute complaints and reviewed radiology reports and findings.  These findings were personally discussed with the patient.  Please see medical record for radiology report. ____________________________________________   PROCEDURES  Procedure(s) performed:  Procedures    Critical Care performed: no ____________________________________________   INITIAL IMPRESSION / ASSESSMENT AND PLAN / ED COURSE  Pertinent labs & imaging results that were available during my care of the patient were reviewed by me and considered in my medical decision making (see chart for details).  DDX: Dehydration, sepsis, pna, uti, hypoglycemia, cva, drug effect, htn urgency, deconditioning   EURYDICE CALIXTO is a 71 y.o. who presents to the ED with symptoms as described above.  Patient is  well-appearing in no acute distress.  She has no focal neuro deficits but is at high risk for therefore T imaging ordered to evaluate for evidence of intracranial abnormality.  CT imaging shows no acute change from previous CT here this not clinically consistent with CVA.  Not clinically consistent with encephalitis or drug withdrawal.  Blood work ordered for the above differential is reassuring.  Will give bolus of IV fluids and check urine.  Clinical Course as of Oct 13 1511  Fri Oct 12, 2017  1045 Urinalysis does show evidence of infection.  Will give dose of IV antibiotics.  Symptoms most likely secondary to UTI.  View medical record it does show that the patient had urine culture that grew Citrobacter sensitive to Rocephin and Keflex.  She was given a dose of Keflex but after being admitted to the hospital she was never continued on this.  [PR]  1224 Patient tolerating oral hydration.  IV Rocephin has infused without any complication.  Patient has no signs or symptoms of sepsis.  IV fluids provided for mild dehydration.  At this point patient is clinically stable for further workup as an outpatient.  Will be discharged with oral Keflex.  [PR]    Clinical Course User Index [PR] Merlyn Lot, MD     ____________________________________________   FINAL CLINICAL IMPRESSION(S) / ED DIAGNOSES  Final diagnoses:  Dizziness  Acute cystitis without hematuria      NEW MEDICATIONS STARTED DURING THIS VISIT:  This SmartLink is deprecated. Use AVSMEDLIST instead to display the medication list for a patient.   Note:  This document was prepared using Dragon voice recognition software and may include unintentional dictation errors.    Merlyn Lot, MD 10/12/17 [phone]

## 2017-10-12 NOTE — ED Notes (Signed)
Patient transported to X-ray 

## 2017-10-12 NOTE — ED Triage Notes (Signed)
Arrives with c/o dizziness, vomiting at night, elevated blood pressure.  States symptom onset Wednesday.

## 2017-12-26 DIAGNOSIS — E038 Other specified hypothyroidism: Secondary | ICD-10-CM | POA: Insufficient documentation

## 2018-03-18 ENCOUNTER — Emergency Department
Admission: EM | Admit: 2018-03-18 | Discharge: 2018-03-18 | Disposition: A | Discharge status: Home / Self Care | Payer: Medicare HMO | Attending: Emergency Medicine | Admitting: Emergency Medicine

## 2018-03-18 ENCOUNTER — Emergency Department: Payer: Medicare HMO

## 2018-03-18 ENCOUNTER — Encounter: Payer: Self-pay | Admitting: Emergency Medicine

## 2018-03-18 ENCOUNTER — Other Ambulatory Visit: Payer: Self-pay

## 2018-03-18 DIAGNOSIS — Z79899 Other long term (current) drug therapy: Secondary | ICD-10-CM | POA: Diagnosis not present

## 2018-03-18 DIAGNOSIS — S32018A Other fracture of first lumbar vertebra, initial encounter for closed fracture: Secondary | ICD-10-CM | POA: Insufficient documentation

## 2018-03-18 DIAGNOSIS — W010XXA Fall on same level from slipping, tripping and stumbling without subsequent striking against object, initial encounter: Secondary | ICD-10-CM | POA: Insufficient documentation

## 2018-03-18 DIAGNOSIS — Z7982 Long term (current) use of aspirin: Secondary | ICD-10-CM | POA: Insufficient documentation

## 2018-03-18 DIAGNOSIS — F17210 Nicotine dependence, cigarettes, uncomplicated: Secondary | ICD-10-CM | POA: Insufficient documentation

## 2018-03-18 DIAGNOSIS — Y929 Unspecified place or not applicable: Secondary | ICD-10-CM | POA: Insufficient documentation

## 2018-03-18 DIAGNOSIS — Y999 Unspecified external cause status: Secondary | ICD-10-CM | POA: Diagnosis not present

## 2018-03-18 DIAGNOSIS — S3992XA Unspecified injury of lower back, initial encounter: Secondary | ICD-10-CM | POA: Diagnosis not present

## 2018-03-18 DIAGNOSIS — Y939 Activity, unspecified: Secondary | ICD-10-CM | POA: Diagnosis not present

## 2018-03-18 DIAGNOSIS — M545 Low back pain: Secondary | ICD-10-CM | POA: Diagnosis not present

## 2018-03-18 DIAGNOSIS — S32010A Wedge compression fracture of first lumbar vertebra, initial encounter for closed fracture: Secondary | ICD-10-CM | POA: Diagnosis not present

## 2018-03-18 DIAGNOSIS — M4856XA Collapsed vertebra, not elsewhere classified, lumbar region, initial encounter for fracture: Secondary | ICD-10-CM

## 2018-03-18 MED ORDER — OXYCODONE-ACETAMINOPHEN 5-325 MG PO TABS
1.0000 | ORAL_TABLET | Freq: Once | ORAL | Status: AC
  Administered 2018-03-18: 1 via ORAL
  Filled 2018-03-18: qty 1

## 2018-03-18 MED ORDER — TRAMADOL HCL 50 MG PO TABS: 50.0000 mg | ORAL_TABLET | Freq: Two times a day (BID) | ORAL | 0 refills | Status: DC | PRN

## 2018-03-18 NOTE — Discharge Instructions (Signed)
Call orthopedic department today to schedule appointment.  Medication may cause drowsiness.

## 2018-03-18 NOTE — ED Triage Notes (Signed)
Fell at home from standing position while lifting something, low back pain since.

## 2018-03-18 NOTE — ED Provider Notes (Signed)
[facility] Emergency Department Provider Note   ____________________________________________   First MD Initiated Contact with Patient 03/18/18 1036     (approximate)  I have reviewed the triage vital signs and the nursing notes.   HISTORY  Chief Complaint Back Pain    HPI Susan Medina is a 72 y.o. female patient complain of low back pain secondary to fall.  Patient states she fell from standing position while lifting something from the bed.  Patient denies radicular component to her back pain.  Patient denies bladder bowel dysfunction.  Patient rates pain as a 10/10.  Patient described pain is "achy".  No palliative measures prior to arrival.  Past Medical History:  Diagnosis Date  . Hypertension     Patient Active Problem List   Diagnosis Date Noted  . Accelerated hypertension 08/18/2017    History reviewed. No pertinent surgical history.  Prior to Admission medications   Medication Sig Start Date End Date Taking? Authorizing Provider  aspirin EC 81 MG EC tablet Take 1 tablet (81 mg total) by mouth daily. 08/19/17   Fritzi Mandes, MD  hydrochlorothiazide (HYDRODIURIL) 25 MG tablet Take 1 tablet (25 mg total) by mouth daily. 08/19/17   Fritzi Mandes, MD  levothyroxine (SYNTHROID, LEVOTHROID) 88 MCG tablet Take 1 tablet by mouth daily. 09/19/17 09/19/18  [provider]  lisinopril (PRINIVIL,ZESTRIL) 10 MG tablet Take 1 tablet (10 mg total) by mouth daily. 08/19/17   Fritzi Mandes, MD  ondansetron (ZOFRAN) 4 MG tablet Take 1 tablet (4 mg total) by mouth daily as needed for nausea or vomiting. 10/12/17 10/12/18  Merlyn Lot, MD  traMADol (ULTRAM) 50 MG tablet Take 1 tablet (50 mg total) by mouth every 12 (twelve) hours as needed. 03/18/18   Sable Feil, PA-C    Allergies Patient has no known allergies.  Family History  Problem Relation Age of Onset  . Diabetes Mother     Social History Social History   Tobacco Use  .  Smoking status: Current Every Day Smoker    Packs/day: 0.50    Years: 50.00    Pack years: 25.00    Types: Cigarettes  . Smokeless tobacco: Never Used  Substance Use Topics  . Alcohol use: No  . Drug use: Yes    Types: Marijuana    Comment: smoked it yesterday.      Review of Systems  Constitutional: No fever/chills Eyes: No visual changes. ENT: No sore throat. Cardiovascular: Denies chest pain. Respiratory: Denies shortness of breath. Gastrointestinal: No abdominal pain.  No nausea, no vomiting.  No diarrhea.  No constipation. Genitourinary: Negative for dysuria. Musculoskeletal: Negative for back pain. Skin: Negative for rash. Neurological: Negative for headaches, focal weakness or numbness. Endocrine:Hypertension ____________________________________________   PHYSICAL EXAM:  VITAL SIGNS: ED Triage Vitals  Enc Vitals Group     BP 03/18/18 1032 (!) 148/102     Pulse Rate 03/18/18 1032 94     Resp 03/18/18 1032 20     Temp 03/18/18 1032 98.4 F (36.9 C)     Temp Source 03/18/18 1032 Oral     SpO2 03/18/18 1032 99 %     Weight 03/18/18 1033 115 lb (52.2 kg)     Height 03/18/18 1033 5\' 2"  (1.575 m)     Head Circumference --      Peak Flow --      Pain Score 03/18/18 1033 10     Pain Loc --      Pain Edu? --  Excl. in [place]? --    Constitutional: Alert and oriented.  Moderate distress.   Neck: No stridor.  No cervical spine tenderness to palpation. Hematological/Lymphatic/Immunilogical: No cervical lymphadenopathy. Cardiovascular: Normal rate, regular rhythm. Grossly normal heart sounds.  Good peripheral circulation. Respiratory: Normal respiratory effort.  No retractions. Lungs CTAB. Gastrointestinal: Soft and nontender. No distention. No abdominal bruits. No CVA tenderness. Musculoskeletal: No obvious spinal deformity.  Patient is moderate guarding palpation of L1-L3.  Patient has decreased range of motion with flexion.  Patient is negative straight leg  test. Neurologic:  Normal speech and language. No gross focal neurologic deficits are appreciated. No gait instability. Skin:  Skin is warm, dry and intact. No rash noted. Psychiatric: Mood and affect are normal. Speech and behavior are normal.  ____________________________________________   LABS (all labs ordered are listed, but only abnormal results are displayed)  Labs Reviewed - No data to display ____________________________________________  EKG   ____________________________________________  RADIOLOGY  ED MD interpretation:    Official radiology report(s): Dg Lumbar Spine Complete  Result Date: 03/18/2018 CLINICAL DATA:  Fall today from standing position with low back pain, initial encounter EXAM: LUMBAR SPINE - COMPLETE 4+ VIEW COMPARISON:  None. FINDINGS: Five lumbar type vertebral bodies are well visualized. There are changes consistent with a compression fracture in the inferior aspect of L1. This is of uncertain chronicity although given the patient's clinical history is likely acute. Correlation to point tenderness over the spinous process is recommended. Osteophytic changes are seen. Disc space narrowing is noted at L3-L4. Diffuse aortic calcifications are seen. No pars defects are noted. IMPRESSION: Changes consistent with an acute L1 compression fracture. Correlation to point tenderness is recommended. Electronically Signed   By: Inez Catalina M.D.   On: 03/18/2018 11:34    ____________________________________________   PROCEDURES  Procedure(s) performed:   Procedures  Critical Care performed:   ____________________________________________   INITIAL IMPRESSION / ASSESSMENT AND PLAN / ED COURSE  As part of my medical decision making, I reviewed the following data within the electronic MEDICAL RECORD NUMBER    Back pain secondary to compression fracture L1.  Patient also has moderate degenerative changes in the lumbar spine.  Discussed x-ray findings with  patient and family.  Patient given discharge care instruction advised to follow orthopedics.  Take pain medication as directed.      ____________________________________________   FINAL CLINICAL IMPRESSION(S) / ED DIAGNOSES  Final diagnoses:  Non-traumatic compression fracture of first lumbar vertebra, initial encounter [facility]     ED Discharge Orders        Ordered    traMADol (ULTRAM) 50 MG tablet  Every 12 hours PRN     03/18/18 1151       Note:  This document was prepared using Dragon voice recognition software and may include unintentional dictation errors.    Sable Feil, PA-C 03/18/18 1158    Schaevitz, Randall An, MD 03/18/18 1325

## 2018-03-21 DIAGNOSIS — M4856XA Collapsed vertebra, not elsewhere classified, lumbar region, initial encounter for fracture: Secondary | ICD-10-CM | POA: Diagnosis not present

## 2018-04-10 DIAGNOSIS — M4856XA Collapsed vertebra, not elsewhere classified, lumbar region, initial encounter for fracture: Secondary | ICD-10-CM | POA: Diagnosis not present

## 2018-04-22 DIAGNOSIS — E538 Deficiency of other specified B group vitamins: Secondary | ICD-10-CM | POA: Diagnosis not present

## 2018-04-22 DIAGNOSIS — I10 Essential (primary) hypertension: Secondary | ICD-10-CM | POA: Diagnosis not present

## 2018-04-22 DIAGNOSIS — S32010D Wedge compression fracture of first lumbar vertebra, subsequent encounter for fracture with routine healing: Secondary | ICD-10-CM | POA: Diagnosis not present

## 2018-04-22 DIAGNOSIS — R634 Abnormal weight loss: Secondary | ICD-10-CM | POA: Diagnosis not present

## 2018-04-22 DIAGNOSIS — E038 Other specified hypothyroidism: Secondary | ICD-10-CM | POA: Diagnosis not present

## 2019-09-04 DIAGNOSIS — E038 Other specified hypothyroidism: Secondary | ICD-10-CM | POA: Diagnosis not present

## 2019-09-04 DIAGNOSIS — E559 Vitamin D deficiency, unspecified: Secondary | ICD-10-CM | POA: Diagnosis not present

## 2019-09-04 DIAGNOSIS — Z760 Encounter for issue of repeat prescription: Secondary | ICD-10-CM | POA: Diagnosis not present

## 2019-09-04 DIAGNOSIS — I10 Essential (primary) hypertension: Secondary | ICD-10-CM | POA: Diagnosis not present

## 2019-09-04 DIAGNOSIS — F17200 Nicotine dependence, unspecified, uncomplicated: Secondary | ICD-10-CM | POA: Diagnosis not present

## 2020-04-21 ENCOUNTER — Emergency Department
Admission: EM | Admit: 2020-04-21 | Discharge: 2020-04-21 | Disposition: A | Discharge status: Home / Self Care | Payer: Medicare HMO | Attending: Student in an Organized Health Care Education/Training Program | Admitting: Student in an Organized Health Care Education/Training Program

## 2020-04-21 ENCOUNTER — Other Ambulatory Visit: Payer: Self-pay

## 2020-04-21 ENCOUNTER — Emergency Department: Payer: Medicare HMO

## 2020-04-21 ENCOUNTER — Encounter: Payer: Self-pay | Admitting: Emergency Medicine

## 2020-04-21 DIAGNOSIS — E119 Type 2 diabetes mellitus without complications: Secondary | ICD-10-CM | POA: Insufficient documentation

## 2020-04-21 DIAGNOSIS — N3091 Cystitis, unspecified with hematuria: Secondary | ICD-10-CM | POA: Diagnosis not present

## 2020-04-21 DIAGNOSIS — F17210 Nicotine dependence, cigarettes, uncomplicated: Secondary | ICD-10-CM | POA: Diagnosis not present

## 2020-04-21 DIAGNOSIS — Z7982 Long term (current) use of aspirin: Secondary | ICD-10-CM | POA: Insufficient documentation

## 2020-04-21 DIAGNOSIS — R319 Hematuria, unspecified: Secondary | ICD-10-CM

## 2020-04-21 DIAGNOSIS — I10 Essential (primary) hypertension: Secondary | ICD-10-CM | POA: Diagnosis not present

## 2020-04-21 DIAGNOSIS — Z79899 Other long term (current) drug therapy: Secondary | ICD-10-CM | POA: Diagnosis not present

## 2020-04-21 DIAGNOSIS — Z7984 Long term (current) use of oral hypoglycemic drugs: Secondary | ICD-10-CM | POA: Insufficient documentation

## 2020-04-21 DIAGNOSIS — N3090 Cystitis, unspecified without hematuria: Secondary | ICD-10-CM

## 2020-04-21 LAB — URINALYSIS, COMPLETE (UACMP) WITH MICROSCOPIC
RBC / HPF: >50 RBC/hpf — ABNORMAL HIGH (ref 0–5)
Specific Gravity, Urine: 1.008 (ref 1.005–1.030)
Squamous Epithelial / LPF: NONE SEEN (ref 0–5)
WBC, UA: >50 WBC/hpf — ABNORMAL HIGH (ref 0–5)

## 2020-04-21 LAB — CBC
HCT: 38.1 % (ref 36.0–46.0)
Hemoglobin: 12.9 g/dL (ref 12.0–15.0)
MCH: 30.8 pg (ref 26.0–34.0)
MCHC: 33.9 g/dL (ref 30.0–36.0)
MCV: 90.9 fL (ref 80.0–100.0)
Platelets: 261 10*3/uL (ref 150–400)
RBC: 4.19 MIL/uL (ref 3.87–5.11)
RDW: 14.8 % (ref 11.5–15.5)
WBC: 11.4 10*3/uL — ABNORMAL HIGH (ref 4.0–10.5)
nRBC: 0 % (ref 0.0–0.2)

## 2020-04-21 LAB — BASIC METABOLIC PANEL
Anion gap: 11 (ref 5–15)
BUN: 23 mg/dL (ref 8–23)
CO2: 24 mmol/L (ref 22–32)
Calcium: 9.3 mg/dL (ref 8.9–10.3)
Chloride: 101 mmol/L (ref 98–111)
Creatinine, Ser: 1.43 mg/dL — ABNORMAL HIGH (ref 0.44–1.00)
GFR calc Af Amer: 41 mL/min — ABNORMAL LOW (ref >60)
GFR calc non Af Amer: 35 mL/min — ABNORMAL LOW (ref >60)
Glucose, Bld: 164 mg/dL — ABNORMAL HIGH (ref 70–99)
Potassium: 4 mmol/L (ref 3.5–5.1)
Sodium: 136 mmol/L (ref 135–145)

## 2020-04-21 MED ORDER — CEPHALEXIN 500 MG PO CAPS
500.0000 mg | ORAL_CAPSULE | Freq: Once | ORAL | Status: AC
  Administered 2020-04-21: 15:00:00 500 mg via ORAL
  Filled 2020-04-21: qty 1

## 2020-04-21 MED ORDER — CEPHALEXIN 500 MG PO CAPS
500.0000 mg | ORAL_CAPSULE | Freq: Three times a day (TID) | ORAL | 0 refills | Status: AC
Start: 2020-04-21 — End: 2020-04-28

## 2020-04-21 NOTE — ED Notes (Signed)
Pt's urine was a dark red color.

## 2020-04-21 NOTE — ED Provider Notes (Signed)
[facility] Emergency Department Provider Note    First MD Initiated Contact with Patient 04/21/20 1425     (approximate)  I have reviewed the triage vital signs and the nursing notes.   HISTORY  Chief Complaint Hematuria    HPI DEBE Susan Medina is a 74 y.o. female the post past medical history is well as history of smoking presents the ER for hematuria as well as urinary frequency and low back pain for the past few days.  Hematuria started this morning however.  States that is since subsided.  She is not having trouble emptying her bladder.  She denies any measured fevers or chills.  No recent antibiotics.  She does take a baby aspirin.    Past Medical History:  Diagnosis Date  . Hypertension    Family History  Problem Relation Age of Onset  . Diabetes Mother    History reviewed. No pertinent surgical history. Patient Active Problem List   Diagnosis Date Noted  . Accelerated hypertension 08/18/2017      Prior to Admission medications   Medication Sig Start Date End Date Taking? Authorizing Provider  aspirin EC 81 MG EC tablet Take 1 tablet (81 mg total) by mouth daily. 08/19/17   Fritzi Mandes, MD  cephALEXin (KEFLEX) 500 MG capsule Take 1 capsule (500 mg total) by mouth 3 (three) times daily for 7 days. 04/21/20 04/28/20  Merlyn Lot, MD  hydrochlorothiazide (HYDRODIURIL) 25 MG tablet Take 1 tablet (25 mg total) by mouth daily. 08/19/17   Fritzi Mandes, MD  levothyroxine (SYNTHROID, LEVOTHROID) 88 MCG tablet Take 1 tablet by mouth daily. 09/19/17 09/19/18  [provider]  lisinopril (PRINIVIL,ZESTRIL) 10 MG tablet Take 1 tablet (10 mg total) by mouth daily. 08/19/17   Fritzi Mandes, MD  traMADol (ULTRAM) 50 MG tablet Take 1 tablet (50 mg total) by mouth every 12 (twelve) hours as needed. 03/18/18   Sable Feil, PA-C    Allergies Patient has no known allergies.    Social History Social History   Tobacco Use  . Smoking  status: Current Every Day Smoker    Packs/day: 0.50    Years: 50.00    Pack years: 25.00    Types: Cigarettes  . Smokeless tobacco: Never Used  Substance Use Topics  . Alcohol use: No  . Drug use: Yes    Types: Marijuana    Comment: smoked it yesterday.      Review of Systems Patient denies headaches, rhinorrhea, blurry vision, numbness, shortness of breath, chest pain, edema, cough, abdominal pain, nausea, vomiting, diarrhea, dysuria, fevers, rashes or hallucinations unless otherwise stated above in HPI. ____________________________________________   PHYSICAL EXAM:  VITAL SIGNS: Vitals:   04/21/20 1145 04/21/20 1443  BP: 95/63 111/68  Pulse: 92 84  Resp: 16 18  Temp:    SpO2: 100% 100%    Constitutional: Alert and oriented.  Eyes: Conjunctivae are normal.  Head: Atraumatic. Nose: No congestion/rhinnorhea. Mouth/Throat: Mucous membranes are moist.   Neck: No stridor. Painless ROM.  Cardiovascular: Normal rate, regular rhythm. Grossly normal heart sounds.  Good peripheral circulation. Respiratory: Normal respiratory effort.  No retractions. Lungs CTAB. Gastrointestinal: Soft and nontender. No distention. No abdominal bruits. No CVA tenderness. Genitourinary:  Musculoskeletal: No lower extremity tenderness nor edema.  No joint effusions. Neurologic:  Normal speech and language. No gross focal neurologic deficits are appreciated. No facial droop Skin:  Skin is warm, dry and intact. No rash noted. Psychiatric: Mood and affect are normal. Speech  and behavior are normal.  ____________________________________________   LABS (all labs ordered are listed, but only abnormal results are displayed)  Results for orders placed or performed during the hospital encounter of 04/21/20 (from the past 24 hour(s))  Urinalysis, Complete w Microscopic     Status: Abnormal   Collection Time: 04/21/20  9:39 AM  Result Value Ref Range   Color, Urine RED (A) YELLOW   APPearance CLOUDY (A)  CLEAR   Specific Gravity, Urine 1.008 1.005 - 1.030   pH  5.0 - 8.0    TEST NOT REPORTED DUE TO COLOR INTERFERENCE OF URINE PIGMENT   Glucose, UA (A) NEGATIVE mg/dL    TEST NOT REPORTED DUE TO COLOR INTERFERENCE OF URINE PIGMENT   Hgb urine dipstick (A) NEGATIVE    TEST NOT REPORTED DUE TO COLOR INTERFERENCE OF URINE PIGMENT   Bilirubin Urine (A) NEGATIVE    TEST NOT REPORTED DUE TO COLOR INTERFERENCE OF URINE PIGMENT   Ketones, ur (A) NEGATIVE mg/dL    TEST NOT REPORTED DUE TO COLOR INTERFERENCE OF URINE PIGMENT   Protein, ur (A) NEGATIVE mg/dL    TEST NOT REPORTED DUE TO COLOR INTERFERENCE OF URINE PIGMENT   Nitrite (A) NEGATIVE    TEST NOT REPORTED DUE TO COLOR INTERFERENCE OF URINE PIGMENT   Leukocytes,Ua (A) NEGATIVE    TEST NOT REPORTED DUE TO COLOR INTERFERENCE OF URINE PIGMENT   RBC / HPF >50 (H) 0 - 5 RBC/hpf   WBC, UA >50 (H) 0 - 5 WBC/hpf   Bacteria, UA MANY (A) NONE SEEN   Squamous Epithelial / LPF NONE SEEN 0 - 5   Mucus PRESENT   CBC     Status: Abnormal   Collection Time: 04/21/20  9:39 AM  Result Value Ref Range   WBC 11.4 (H) 4.0 - 10.5 K/uL   RBC 4.19 3.87 - 5.11 MIL/uL   Hemoglobin 12.9 12.0 - 15.0 g/dL   HCT 38.1 36 - 46 %   MCV 90.9 80.0 - 100.0 fL   MCH 30.8 26.0 - 34.0 pg   MCHC 33.9 30.0 - 36.0 g/dL   RDW 14.8 11.5 - 15.5 %   Platelets 261 150 - 400 K/uL   nRBC 0.0 0.0 - 0.2 %  Basic metabolic panel     Status: Abnormal   Collection Time: 04/21/20  9:39 AM  Result Value Ref Range   Sodium 136 135 - 145 mmol/L   Potassium 4.0 3.5 - 5.1 mmol/L   Chloride 101 98 - 111 mmol/L   CO2 24 22 - 32 mmol/L   Glucose, Bld 164 (H) 70 - 99 mg/dL   BUN 23 8 - 23 mg/dL   Creatinine, Ser 1.43 (H) 0.44 - 1.00 mg/dL   Calcium 9.3 8.9 - 10.3 mg/dL   GFR calc non Af Amer 35 (L) >60 mL/min   GFR calc Af Amer 41 (L) >60 mL/min   Anion gap 11 5 - 15   ____________________________________________ ____________________________________________  RADIOLOGY  I  personally reviewed all radiographic images ordered to evaluate for the above acute complaints and reviewed radiology reports and findings.  These findings were personally discussed with the patient.  Please see medical record for radiology report.  ____________________________________________   PROCEDURES  Procedure(s) performed:  Procedures    Critical Care performed: no ____________________________________________   INITIAL IMPRESSION / ASSESSMENT AND PLAN / ED COURSE  Pertinent labs & imaging results that were available during my care of the patient were reviewed by me  and considered in my medical decision making (see chart for details).   DDX: Cystitis, stone, pyelonephritis, mass, hemorrhagic cystitis  TASHAWNDA BLEILER is a 74 y.o. who presents to the ED with symptoms as described above.  Patient nontoxic-appearing in no acute distress.  Her exam is reassuring.  Urine does appear with rare bacteria and with gross hematuria.  CT imaging does not show any evidence of stone but does show asymmetric abnormality of bladder wall.  He never smoking history I spoke with Dr. Erlene Quan of urology who agrees to see patient in outpatient follow-up.  I do not see any indication for hospitalization at this time.  Will be placed on antibiotics given her bacteria.  We discussed signs and symptoms for which he should return to the ER.  Patient was able to tolerate PO and was able to ambulate with a steady gait.  Have discussed with the patient and available family all diagnostics and treatments performed thus far and all questions were answered to the best of my ability. The patient demonstrates understanding and agreement with plan.      The patient was evaluated in Emergency Department today for the symptoms described in the history of present illness. He/she was evaluated in the context of the global COVID-19 pandemic, which necessitated consideration that the patient might be at risk for infection  with the SARS-CoV-2 virus that causes COVID-19. Institutional protocols and algorithms that pertain to the evaluation of patients at risk for COVID-19 are in a state of rapid change based on information released by regulatory bodies including the CDC and federal and state organizations. These policies and algorithms were followed during the patient's care in the ED.  As part of my medical decision making, I reviewed the following data within the [place] notes reviewed and incorporated, Labs reviewed, notes from prior ED visits and [place] Controlled Substance Database   ____________________________________________   FINAL CLINICAL IMPRESSION(S) / ED DIAGNOSES  Final diagnoses:  Hematuria, unspecified type  Cystitis      NEW MEDICATIONS STARTED DURING THIS VISIT:  New Prescriptions   CEPHALEXIN (KEFLEX) 500 MG CAPSULE    Take 1 capsule (500 mg total) by mouth 3 (three) times daily for 7 days.     Note:  This document was prepared using Dragon voice recognition software and may include unintentional dictation errors.    Merlyn Lot, MD 04/21/20 [phone]

## 2020-04-21 NOTE — ED Triage Notes (Signed)
Pt here for hematuria starting today. No history of kidney stones. Having some lower back pain at this time but none when this started.  NAD.

## 2020-04-22 NOTE — Progress Notes (Signed)
04/23/20 12:45 PM   Susan Medina [birth year] [id number]  Referring provider: Baxter Hire, MD [place],  [place] [address] Chief Complaint  Patient presents with  . New Patient (Initial Visit)    ER follow-up Hematuria    HPI: Susan Medina is a 74 y.o. F who presents today for the evaluation and management of hematuria.   Visited ED on 04/21/20 c/o hematuria onset morning of ED visit w/ associated sxs of urinary frequency and low back pain onset few days prior to ED visit. Her UA in ED was difficult to evaluate given multiple blood and species. CT imaging shows no evidence of stone however does show asymmetric abnormality of bladder wall. Discharged w/ Keflex 500 mg capsule.   She states of never experiencing gross hematuria prior to this incident and had associated symptoms of burning w/ urination. She reports of clearance of blood in urine currently.   Current smoker w/ a couple a day.   PMH: Past Medical History:  Diagnosis Date  . Hypertension     Surgical History: No past surgical history on file.  Home Medications:  Allergies as of 04/23/2020   No Known Allergies     Medication List       Accurate as of April 23, 2020 11:59 PM. If you have any questions, ask your nurse or doctor.        aspirin 81 MG EC tablet Take 1 tablet (81 mg total) by mouth daily.   cephALEXin 500 MG capsule Commonly known as: KEFLEX Take 1 capsule (500 mg total) by mouth 3 (three) times daily for 7 days.   fluconazole 150 MG tablet Commonly known as: DIFLUCAN Take 1 tablet (150 mg total) by mouth once for 1 dose. Started by: Hollice Espy, MD   hydrochlorothiazide 25 MG tablet Commonly known as: HYDRODIURIL Take 1 tablet (25 mg total) by mouth daily.   levothyroxine 88 MCG tablet Commonly known as: SYNTHROID Take 1 tablet by mouth daily.   lisinopril 10 MG tablet Commonly known as: ZESTRIL Take 1 tablet (10 mg total) by mouth daily.     traMADol 50 MG tablet Commonly known as: Ultram Take 1 tablet (50 mg total) by mouth every 12 (twelve) hours as needed.       Allergies: No Known Allergies  Family History: Family History  Problem Relation Age of Onset  . Diabetes Mother     Social History:  reports that she has been smoking cigarettes. She has a 25.00 pack-year smoking history. She has never used smokeless tobacco. She reports current drug use. Drug: Marijuana. She reports that she does not drink alcohol.   Physical Exam: BP (!) 169/84   Pulse 87   Ht 5\' 3"  (1.6 m)   Wt 142 lb (64.4 kg)   BMI 25.15 kg/m   Constitutional:  Alert and oriented, No acute distress. HEENT: [place] AT, moist mucus membranes.  Trachea midline, no masses. Cardiovascular: No clubbing, cyanosis, or edema. Respiratory: Normal respiratory effort, no increased work of breathing. Skin: No rashes, bruises or suspicious lesions. Neurologic: Grossly intact, no focal deficits, moving all 4 extremities. Psychiatric: Normal mood and affect.  Laboratory Data:  Lab Results  Component Value Date   CREATININE 1.43 (H) 04/21/2020   Pertinent Imaging: Results for orders placed during the hospital encounter of 04/21/20  CT Renal Stone Study  Narrative CLINICAL DATA:  Hematuria  EXAM: CT ABDOMEN AND PELVIS WITHOUT CONTRAST  TECHNIQUE: Multidetector CT imaging of the  abdomen and pelvis was performed following the standard protocol without IV contrast.  COMPARISON:  None.  FINDINGS: Lower chest: No acute abnormality.  Hepatobiliary: No focal liver abnormality is seen. No gallstones, gallbladder wall thickening, or biliary dilatation.  Pancreas: Unremarkable.  Spleen: Unremarkable.  Adrenals/Urinary Tract: There is a 1.7 cm low-attenuation lesion of the left adrenal compatible with an adenoma. Right adrenal is unremarkable. Right renal cyst is noted. There is a too small to characterize hypoattenuating lesion of the lower pole of  the left kidney. Asymmetric soft tissue at the base of the bladder.  Stomach/Bowel: Stomach is within normal limits. Bowel is normal in caliber.  Vascular/Lymphatic: Aortic atherosclerosis. There are no enlarged lymph nodes identified.  Reproductive: Fibroid uterus.  No adnexal mass.  Other: No ascites.  Abdominal wall is unremarkable.  Musculoskeletal: Chronic compression deformity of L1.  IMPRESSION: Asymmetric soft tissue at the base of the bladder. Consider cystoscopy to exclude neoplasm.   Electronically Signed By: Macy Mis M.D. On: 04/21/2020 14:27  I have personally reviewed the images and agree with radiologist interpretation.   Assessment & Plan:    1. Gross hematuria  Risk factors include smoking  Hematuria episode on morning of ED visit Recommended cysto - very anxious today and would like to go ahead and have procedure today- ASAP to expedite further care  2. Bladder tumor  Cystoscopy today revealed 2 cm spherical tumor on anterior bladder neck  Discussed TURBT w/ retrograde/intravesicle chemotherapy w/ associated risks and benefits including blood in urine, risk of infection, and damage to bladder or surrounding structures.   3. Vaginal itching Likely related to abx Rx of Diflucan sent to [facility] [address] [place], [place] [address] [phone]  I, Lucas Mallow, am acting as a scribe for Dr. Hollice Espy,  I have reviewed the above documentation for accuracy and completeness, and I agree with the above.   Hollice Espy, MD

## 2020-04-23 ENCOUNTER — Other Ambulatory Visit: Payer: Self-pay | Admitting: Radiology

## 2020-04-23 ENCOUNTER — Other Ambulatory Visit: Payer: Self-pay

## 2020-04-23 ENCOUNTER — Telehealth: Payer: Self-pay | Admitting: Radiology

## 2020-04-23 ENCOUNTER — Ambulatory Visit (INDEPENDENT_AMBULATORY_CARE_PROVIDER_SITE_OTHER): Payer: Medicare HMO | Admitting: Urology

## 2020-04-23 ENCOUNTER — Encounter: Payer: Self-pay | Admitting: Urology

## 2020-04-23 VITALS — BP 169/84 | HR 87 | Ht 63.0 in | Wt 142.0 lb

## 2020-04-23 DIAGNOSIS — C675 Malignant neoplasm of bladder neck: Secondary | ICD-10-CM

## 2020-04-23 DIAGNOSIS — R310 Gross hematuria: Secondary | ICD-10-CM | POA: Diagnosis not present

## 2020-04-23 DIAGNOSIS — D494 Neoplasm of unspecified behavior of bladder: Secondary | ICD-10-CM

## 2020-04-23 LAB — URINE CULTURE

## 2020-04-23 MED ORDER — GEMCITABINE CHEMO FOR BLADDER INSTILLATION 2000 MG: 2000.0000 mg | Freq: Once | INTRAVENOUS | Status: DC

## 2020-04-23 MED ORDER — FLUCONAZOLE 150 MG PO TABS: 150.0000 mg | ORAL_TABLET | Freq: Once | ORAL | 1 refills | Status: AC

## 2020-04-23 NOTE — Progress Notes (Signed)
   04/23/20   CC:  Chief Complaint  Patient presents with  . New Patient (Initial Visit)    ER follow-up Hematuria    HPI: [name] is a 74 y.o. F who presents today for the evaluation and management of hematuria.   Please see previous note from today for details.   Blood pressure (!) 169/84, pulse 87, height 5\' 3"  (1.6 m), weight 142 lb (64.4 kg). NED. A&Ox3.   No respiratory distress   Abd soft, NT, ND Normal external genitalia with patent urethral meatus  Cystoscopy Procedure Note  Patient identification was confirmed, informed consent was obtained, and patient was prepped using Betadine solution.  Lidocaine jelly was administered per urethral meatus.    Procedure: - Flexible cystoscope introduced, without any difficulty.   - Thorough search of the bladder revealed:    normal urethral meatus    normal urothelium    no stones    no ulcers    no urethral polyps    no trabeculation    2 cm spherical tumor on anterior bladder neck   - Ureteral orifices were normal in position and appearance.  Post-Procedure: - Patient tolerated the procedure well  I, Nethusan Sivanesan, am acting as a scribe for Dr. Hollice Espy,  I have reviewed the above documentation for accuracy and completeness, and I agree with the above.   Hollice Espy, MD

## 2020-04-23 NOTE — Telephone Encounter (Signed)
Unable to reach patient by phone. Spoke to sister, Carlyon Shadow, to request patient return call regarding surgery.

## 2020-04-23 NOTE — H&P (View-Only) (Signed)
   04/23/20   CC:  Chief Complaint  Patient presents with  . New Patient (Initial Visit)    ER follow-up Hematuria    HPI: Susan Medina is a 74 y.o. F who presents today for the evaluation and management of hematuria.   Please see previous note from today for details.   Blood pressure (!) 169/84, pulse 87, height 5\' 3"  (1.6 m), weight 142 lb (64.4 kg). NED. A&Ox3.   No respiratory distress   Abd soft, NT, ND Normal external genitalia with patent urethral meatus  Cystoscopy Procedure Note  Patient identification was confirmed, informed consent was obtained, and patient was prepped using Betadine solution.  Lidocaine jelly was administered per urethral meatus.    Procedure: - Flexible cystoscope introduced, without any difficulty.   - Thorough search of the bladder revealed:    normal urethral meatus    normal urothelium    no stones    no ulcers    no urethral polyps    no trabeculation    2 cm spherical tumor on anterior bladder neck   - Ureteral orifices were normal in position and appearance.  Post-Procedure: - Patient tolerated the procedure well  I, Nethusan Sivanesan, am acting as a scribe for Dr. Hollice Espy,  I have reviewed the above documentation for accuracy and completeness, and I agree with the above.   Hollice Espy, MD

## 2020-05-06 ENCOUNTER — Encounter
Admission: RE | Admit: 2020-05-06 | Discharge: 2020-05-06 | Disposition: A | Discharge status: Home / Self Care | Payer: Medicare HMO | Source: Ambulatory Visit | Attending: Urology | Admitting: Urology

## 2020-05-06 ENCOUNTER — Other Ambulatory Visit: Payer: Self-pay

## 2020-05-06 HISTORY — DX: Malignant (primary) neoplasm, unspecified: C80.1

## 2020-05-06 HISTORY — DX: Unspecified osteoarthritis, unspecified site: M19.90

## 2020-05-06 HISTORY — DX: Hypothyroidism, unspecified: E03.9

## 2020-05-06 HISTORY — DX: Gastro-esophageal reflux disease without esophagitis: K21.9

## 2020-05-06 NOTE — Patient Instructions (Signed)
Your procedure is scheduled on: 05-13-20 THURSDAY Report to Same Day Surgery 2nd floor medical mall [facility] Entrance-take elevator on left to 2nd floor.  Check in with surgery information desk.) To find out your arrival time please call [phone] between 1PM - 3PM on 05-12-20 [facility]  Remember: Instructions that are not followed completely may result in serious medical risk, up to and including death, or upon the discretion of your surgeon and anesthesiologist your surgery may need to be rescheduled.    _x___ 1. Do not eat food after midnight the night before your procedure. NO GUM OR CANDY AFTER MIDNIGHT. You may drink clear liquids up to 2 hours before you are scheduled to arrive at the hospital for your procedure.  Do not drink clear liquids within 2 hours of your scheduled arrival to the hospital.  Clear liquids include  --Water or Apple juice without pulp  --Gatorade  --Black Coffee or Clear Tea (No milk, no creamers, do not add anything to the coffee or Tea-ok to add sugar)     __x__ 2. No Alcohol for 24 hours before or after surgery.   __x__3. No Smoking or e-cigarettes for 24 prior to surgery.  Do not use any chewable tobacco products for at least 6 hour prior to surgery   ____  4. Bring all medications with you on the day of surgery if instructed.    __x__ 5. Notify your doctor if there is any change in your medical condition     (cold, fever, infections).    x___6. On the morning of surgery brush your teeth with toothpaste and water.  You may rinse your mouth with mouth wash if you wish.  Do not swallow any toothpaste or mouthwash.   Do not wear jewelry, make-up, hairpins, clips or nail polish.  Do not wear lotions, powders, or perfumes. You may wear deodorant.  Do not shave 48 hours prior to surgery. Men may shave face and neck.  Do not bring valuables to the hospital.    [facility] is not responsible for any belongings or valuables.               Contacts,  dentures or bridgework may not be worn into surgery.  Leave your suitcase in the car. After surgery it may be brought to your room.  For patients admitted to the hospital, discharge time is determined by your treatment team.  _  Patients discharged the day of surgery will not be allowed to drive home.  You will need someone to drive you home and stay with you the night of your procedure.    Please read over the following fact sheets that you were given:   [facility] Preparing for Surgery   _x___ TAKE THE FOLLOWING MEDICATION THE MORNING OF SURGERY WITH A SMALL SIP OF WATER. These include:  1. SYNTHROID (LEVOTHYROXINE)  2.  3.  4.  5.  6.  ____Fleets enema or Magnesium Citrate as directed.   ____ Use CHG Soap or sage wipes as directed on instruction sheet   ____ Use inhalers on the day of surgery and bring to hospital day of surgery  ____ Stop Metformin and Janumet 2 days prior to surgery.    ____ Take 1/2 of usual insulin dose the night before surgery and none on the morning surgery.   _x___ Follow recommendations from Cardiologist, Pulmonologist or PCP regarding stopping Aspirin, Coumadin, Plavix ,Eliquis, Effient, or Pradaxa, and Pletal-LAST DOSE OF ASPIRIN WAS ON 05-05-20 AS  INSTRUCTED BY DR Cherrie Gauze OFFICE  X____Stop Anti-inflammatories such as Advil, Aleve, Ibuprofen, Motrin, Naproxen, Naprosyn, Goodies powders or aspirin products NOW-OK to take Tylenol    ____ Stop supplements until after surgery.   ____ Bring C-Pap to the hospital.

## 2020-05-10 ENCOUNTER — Encounter
Admission: RE | Admit: 2020-05-10 | Discharge: 2020-05-10 | Disposition: A | Discharge status: Home / Self Care | Payer: Medicare HMO | Source: Ambulatory Visit | Attending: Urology | Admitting: Urology

## 2020-05-10 ENCOUNTER — Encounter: Payer: Self-pay | Admitting: Urgent Care

## 2020-05-10 ENCOUNTER — Other Ambulatory Visit: Payer: Self-pay

## 2020-05-10 DIAGNOSIS — Z87891 Personal history of nicotine dependence: Secondary | ICD-10-CM | POA: Insufficient documentation

## 2020-05-10 DIAGNOSIS — I10 Essential (primary) hypertension: Secondary | ICD-10-CM | POA: Insufficient documentation

## 2020-05-10 DIAGNOSIS — Z01818 Encounter for other preprocedural examination: Secondary | ICD-10-CM | POA: Diagnosis not present

## 2020-05-10 NOTE — Progress Notes (Signed)
  [facility] Perioperative Services: Pre-Admission/Anesthesia Testing      Date: 05/10/20  Name: Susan Medina MRN:   [id number]  Re: ECG changes  Patient in for PAT appointment today prior to TURBT with intravesical gemcitabine scheduled for 05/13/2020 with Dr. Hollice Espy. ECG performed today revealed lateral T wave inversions (V4-V6). Tracing compared to last ECG that was performed in 2018 and the changes are noted to have progressed. There are no records of a previous ischemic work-up available for review in Epic [facility] or Care Everywhere). Patient has not had recent lipid testing. She is on daily low dose ASA and ACEi therapy.  Patient with significant cardiac risk factors including: age, race, HTN, and former tobacco use (25 pack years; quit 03/2020). Reviewed with [facility] heartcare provider Gilford Rile, PA-C) who advised that patient needs to be seen by cardiology prior to proceeding with surgery. Spoke with patient's PCP to discuss referral to cardiology either here at [facility] or with [facility]. ECG reviewed by PCP. Dr. Edwina Barth wants to see patient in the office tomorrow for a repeat ECG and possible same day consult visit with cardiology. MD to have his office staff contact patient to arrange. I requested that office staff communicate plan of care with me as it becomes available so that I can let the surgical team know plans for upcoming procedure (postpone vs. proceed).   Honor Loh, MSN, APRN, FNP-C, CEN [facility]  Peri-operative Services Nurse Practitioner Phone: [phone] 05/10/20 4:37 PM

## 2020-05-11 ENCOUNTER — Other Ambulatory Visit
Admission: RE | Admit: 2020-05-11 | Discharge: 2020-05-11 | Disposition: A | Discharge status: Home / Self Care | Payer: Medicare HMO | Source: Ambulatory Visit | Attending: Urology | Admitting: Urology

## 2020-05-11 DIAGNOSIS — R0600 Dyspnea, unspecified: Secondary | ICD-10-CM | POA: Diagnosis not present

## 2020-05-11 DIAGNOSIS — Z01812 Encounter for preprocedural laboratory examination: Secondary | ICD-10-CM | POA: Diagnosis not present

## 2020-05-11 DIAGNOSIS — E079 Disorder of thyroid, unspecified: Secondary | ICD-10-CM | POA: Diagnosis not present

## 2020-05-11 DIAGNOSIS — Z720 Tobacco use: Secondary | ICD-10-CM | POA: Diagnosis not present

## 2020-05-11 DIAGNOSIS — R9431 Abnormal electrocardiogram [ECG] [EKG]: Secondary | ICD-10-CM | POA: Diagnosis not present

## 2020-05-11 DIAGNOSIS — E782 Mixed hyperlipidemia: Secondary | ICD-10-CM | POA: Diagnosis not present

## 2020-05-11 DIAGNOSIS — I10 Essential (primary) hypertension: Secondary | ICD-10-CM | POA: Diagnosis not present

## 2020-05-11 DIAGNOSIS — Z20822 Contact with and (suspected) exposure to covid-19: Secondary | ICD-10-CM | POA: Diagnosis not present

## 2020-05-11 DIAGNOSIS — Z01818 Encounter for other preprocedural examination: Secondary | ICD-10-CM | POA: Diagnosis not present

## 2020-05-12 LAB — SARS CORONAVIRUS 2 (TAT 6-24 HRS): SARS Coronavirus 2: NEGATIVE

## 2020-05-12 NOTE — Progress Notes (Signed)
  [facility] Perioperative Services: Pre-Admission/Anesthesia Testing      Date: 05/12/20  Name: Susan Medina MRN:   [id number]  Re: Cardiac clearance for surgery  Patient seen and evaluated by cardiology on 05/11/2020. Plans are to pursue further ischemic workup on a non-emergent basis. Stress test and echocardiogram to be performed at a later date (after surgery). Cardiology has issued clearance for the planned procedure with a MODERATE risk stratification.  This is a non-elective case. Given the fact that delaying her procedure could result in growth of her bladder tumor, the benefits outweigh the associated risks of the procedure. Discussed case with attending anesthesiologist on call Bertell Maria, MD) who agrees that given the unknown nature of her bladder tumor we should proceed with the resection and intravesical chemotherapy instillation as planned.   Honor Loh, MSN, APRN, FNP-C, CEN [facility]  Peri-operative Services Nurse Practitioner Phone: [phone] 05/12/20 2:25 PM

## 2020-05-13 ENCOUNTER — Encounter: Payer: Self-pay | Admitting: Urology

## 2020-05-13 ENCOUNTER — Encounter
Admission: RE | Disposition: A | Discharge status: Home / Self Care | Payer: Self-pay | Source: Home / Self Care | Attending: Urology

## 2020-05-13 ENCOUNTER — Ambulatory Visit
Admission: RE | Admit: 2020-05-13 | Discharge: 2020-05-13 | Disposition: A | Discharge status: Home / Self Care | Payer: Medicare HMO | Attending: Urology | Admitting: Urology

## 2020-05-13 ENCOUNTER — Ambulatory Visit: Payer: Medicare HMO | Admitting: Urgent Care

## 2020-05-13 ENCOUNTER — Other Ambulatory Visit: Payer: Self-pay

## 2020-05-13 ENCOUNTER — Ambulatory Visit: Payer: Medicare HMO

## 2020-05-13 DIAGNOSIS — I10 Essential (primary) hypertension: Secondary | ICD-10-CM | POA: Diagnosis not present

## 2020-05-13 DIAGNOSIS — D494 Neoplasm of unspecified behavior of bladder: Secondary | ICD-10-CM

## 2020-05-13 DIAGNOSIS — C674 Malignant neoplasm of posterior wall of bladder: Secondary | ICD-10-CM | POA: Diagnosis not present

## 2020-05-13 DIAGNOSIS — Z87891 Personal history of nicotine dependence: Secondary | ICD-10-CM | POA: Diagnosis not present

## 2020-05-13 DIAGNOSIS — K219 Gastro-esophageal reflux disease without esophagitis: Secondary | ICD-10-CM | POA: Diagnosis not present

## 2020-05-13 DIAGNOSIS — C675 Malignant neoplasm of bladder neck: Secondary | ICD-10-CM | POA: Diagnosis not present

## 2020-05-13 DIAGNOSIS — E039 Hypothyroidism, unspecified: Secondary | ICD-10-CM | POA: Diagnosis not present

## 2020-05-13 DIAGNOSIS — D090 Carcinoma in situ of bladder: Secondary | ICD-10-CM | POA: Diagnosis not present

## 2020-05-13 HISTORY — PX: TRANSURETHRAL RESECTION OF BLADDER TUMOR WITH MITOMYCIN-C: SHX6459

## 2020-05-13 HISTORY — PX: CYSTOSCOPY W/ RETROGRADES: SHX1426

## 2020-05-13 LAB — URINE DRUG SCREEN, QUALITATIVE (ARMC ONLY)
Amphetamines, Ur Screen: NOT DETECTED
Barbiturates, Ur Screen: NOT DETECTED
Benzodiazepine, Ur Scrn: NOT DETECTED
Cannabinoid 50 Ng, Ur ~~LOC~~: POSITIVE — AB
Cocaine Metabolite,Ur ~~LOC~~: NOT DETECTED
MDMA (Ecstasy)Ur Screen: NOT DETECTED
Methadone Scn, Ur: NOT DETECTED
Opiate, Ur Screen: NOT DETECTED
Phencyclidine (PCP) Ur S: NOT DETECTED
Tricyclic, Ur Screen: NOT DETECTED

## 2020-05-13 SURGERY — TRANSURETHRAL RESECTION OF BLADDER TUMOR WITH MITOMYCIN-C
Anesthesia: General | Site: Ureter

## 2020-05-13 MED ORDER — CEFAZOLIN SODIUM-DEXTROSE 2-4 GM/100ML-% IV SOLN
2.0000 g | INTRAVENOUS | Status: AC
  Administered 2020-05-13: 2 g via INTRAVENOUS

## 2020-05-13 MED ORDER — PHENAZOPYRIDINE HCL 200 MG PO TABS
200.0000 mg | ORAL_TABLET | Freq: Three times a day (TID) | ORAL | 0 refills | Status: DC | PRN
Start: 2020-05-13 — End: 2020-06-30

## 2020-05-13 MED ORDER — GEMCITABINE CHEMO FOR BLADDER INSTILLATION 2000 MG
INTRAVENOUS | Status: DC | PRN
  Administered 2020-05-13: 2000 mg via INTRAVESICAL

## 2020-05-13 MED ORDER — LIDOCAINE HCL (CARDIAC) PF 100 MG/5ML IV SOSY
PREFILLED_SYRINGE | INTRAVENOUS | Status: DC | PRN
  Administered 2020-05-13: 100 mg via INTRAVENOUS

## 2020-05-13 MED ORDER — FAMOTIDINE 20 MG PO TABS
ORAL_TABLET | ORAL | Status: AC
  Administered 2020-05-13: 20 mg via ORAL
  Filled 2020-05-13: qty 1

## 2020-05-13 MED ORDER — FENTANYL CITRATE (PF) 100 MCG/2ML IJ SOLN
INTRAMUSCULAR | Status: DC | PRN
  Administered 2020-05-13 (×3): 25 ug via INTRAVENOUS
  Administered 2020-05-13: 100 ug via INTRAVENOUS
  Administered 2020-05-13: 25 ug via INTRAVENOUS

## 2020-05-13 MED ORDER — ONDANSETRON HCL 4 MG/2ML IJ SOLN
INTRAMUSCULAR | Status: DC | PRN
  Administered 2020-05-13: 4 mg via INTRAVENOUS

## 2020-05-13 MED ORDER — ONDANSETRON HCL 4 MG/2ML IJ SOLN
INTRAMUSCULAR | Status: AC
  Filled 2020-05-13: qty 2

## 2020-05-13 MED ORDER — PROPOFOL 10 MG/ML IV BOLUS
INTRAVENOUS | Status: AC
  Filled 2020-05-13: qty 20

## 2020-05-13 MED ORDER — IOHEXOL 180 MG/ML  SOLN
INTRAMUSCULAR | Status: DC | PRN
  Administered 2020-05-13: 20 mL

## 2020-05-13 MED ORDER — FENTANYL CITRATE (PF) 100 MCG/2ML IJ SOLN
INTRAMUSCULAR | Status: AC
  Filled 2020-05-13: qty 4

## 2020-05-13 MED ORDER — EPHEDRINE SULFATE 50 MG/ML IJ SOLN
INTRAMUSCULAR | Status: DC | PRN
  Administered 2020-05-13 (×2): 5 mg via INTRAVENOUS

## 2020-05-13 MED ORDER — GLYCOPYRROLATE 0.2 MG/ML IJ SOLN
INTRAMUSCULAR | Status: DC | PRN
  Administered 2020-05-13: .1 mg via INTRAVENOUS

## 2020-05-13 MED ORDER — CEFAZOLIN SODIUM-DEXTROSE 2-4 GM/100ML-% IV SOLN
INTRAVENOUS | Status: AC
  Filled 2020-05-13: qty 100

## 2020-05-13 MED ORDER — CHLORHEXIDINE GLUCONATE 0.12 % MT SOLN
OROMUCOSAL | Status: AC
  Administered 2020-05-13: 15 mL via OROMUCOSAL
  Filled 2020-05-13: qty 15

## 2020-05-13 MED ORDER — ROCURONIUM BROMIDE 10 MG/ML (PF) SYRINGE
PREFILLED_SYRINGE | INTRAVENOUS | Status: AC
  Filled 2020-05-13: qty 10

## 2020-05-13 MED ORDER — TRAMADOL HCL 50 MG PO TABS: 50.0000 mg | ORAL_TABLET | Freq: Two times a day (BID) | ORAL | 0 refills | Status: DC | PRN

## 2020-05-13 MED ORDER — ONDANSETRON HCL 4 MG/2ML IJ SOLN: 4.0000 mg | Freq: Once | INTRAMUSCULAR | Status: DC | PRN

## 2020-05-13 MED ORDER — OXYBUTYNIN CHLORIDE 5 MG PO TABS
5.0000 mg | ORAL_TABLET | Freq: Three times a day (TID) | ORAL | 0 refills | Status: DC | PRN
Start: 2020-05-13 — End: 2020-06-30

## 2020-05-13 MED ORDER — ORAL CARE MOUTH RINSE: 15.0000 mL | Freq: Once | OROMUCOSAL | Status: AC

## 2020-05-13 MED ORDER — ROCURONIUM BROMIDE 100 MG/10ML IV SOLN
INTRAVENOUS | Status: DC | PRN
  Administered 2020-05-13: 50 mg via INTRAVENOUS

## 2020-05-13 MED ORDER — SUGAMMADEX SODIUM 200 MG/2ML IV SOLN
INTRAVENOUS | Status: DC | PRN
  Administered 2020-05-13: 200 mg via INTRAVENOUS

## 2020-05-13 MED ORDER — CHLORHEXIDINE GLUCONATE 0.12 % MT SOLN: 15.0000 mL | Freq: Once | OROMUCOSAL | Status: AC

## 2020-05-13 MED ORDER — GLYCOPYRROLATE 0.2 MG/ML IJ SOLN
INTRAMUSCULAR | Status: AC
  Filled 2020-05-13: qty 1

## 2020-05-13 MED ORDER — DEXAMETHASONE SODIUM PHOSPHATE 10 MG/ML IJ SOLN
INTRAMUSCULAR | Status: DC | PRN
  Administered 2020-05-13: 10 mg via INTRAVENOUS

## 2020-05-13 MED ORDER — LACTATED RINGERS IV SOLN: INTRAVENOUS | Status: DC

## 2020-05-13 MED ORDER — DEXAMETHASONE SODIUM PHOSPHATE 10 MG/ML IJ SOLN
INTRAMUSCULAR | Status: AC
  Filled 2020-05-13: qty 1

## 2020-05-13 MED ORDER — FENTANYL CITRATE (PF) 100 MCG/2ML IJ SOLN: 25.0000 ug | INTRAMUSCULAR | Status: DC | PRN

## 2020-05-13 MED ORDER — FAMOTIDINE 20 MG PO TABS: 20.0000 mg | ORAL_TABLET | Freq: Once | ORAL | Status: AC

## 2020-05-13 MED ORDER — PROPOFOL 10 MG/ML IV BOLUS
INTRAVENOUS | Status: DC | PRN
  Administered 2020-05-13: 200 mg via INTRAVENOUS

## 2020-05-13 MED ORDER — EPHEDRINE 5 MG/ML INJ
INTRAVENOUS | Status: AC
  Filled 2020-05-13: qty 10

## 2020-05-13 SURGICAL SUPPLY — 32 items
BAG DRAIN CYSTO-URO LG1000N (MISCELLANEOUS) ×4 IMPLANT
BAG URINE DRAIN 2000ML AR STRL (UROLOGICAL SUPPLIES) ×4 IMPLANT
BRUSH SCRUB EZ  4% CHG (MISCELLANEOUS) ×2
BRUSH SCRUB EZ 1% IODOPHOR (MISCELLANEOUS) ×4 IMPLANT
BRUSH SCRUB EZ 4% CHG (MISCELLANEOUS) ×2 IMPLANT
CATH FOLEY 2WAY  5CC 16FR (CATHETERS) ×2
CATH URETL 5X70 OPEN END (CATHETERS) ×4 IMPLANT
CATH URTH 16FR FL 2W BLN LF (CATHETERS) ×2 IMPLANT
DRAPE UTILITY 15X26 TOWEL STRL (DRAPES) ×4 IMPLANT
DRSG TELFA 4X3 1S NADH ST (GAUZE/BANDAGES/DRESSINGS) ×4 IMPLANT
ELECT LOOP 22F BIPOLAR SML (ELECTROSURGICAL) ×4
ELECT REM PT RETURN 9FT ADLT (ELECTROSURGICAL)
ELECTRODE LOOP 22F BIPOLAR SML (ELECTROSURGICAL) ×2 IMPLANT
ELECTRODE REM PT RTRN 9FT ADLT (ELECTROSURGICAL) IMPLANT
GLOVE BIO SURGEON STRL SZ 6.5 (GLOVE) ×3 IMPLANT
GLOVE BIO SURGEONS STRL SZ 6.5 (GLOVE) ×1
GOWN STRL REUS W/ TWL LRG LVL3 (GOWN DISPOSABLE) ×4 IMPLANT
GOWN STRL REUS W/TWL LRG LVL3 (GOWN DISPOSABLE) ×4
GUIDEWIRE STR DUAL SENSOR (WIRE) IMPLANT
KIT TURNOVER CYSTO (KITS) ×4 IMPLANT
LOOP CUT BIPOLAR 24F LRG (ELECTROSURGICAL) IMPLANT
NDL SAFETY ECLIPSE 18X1.5 (NEEDLE) ×2 IMPLANT
NEEDLE HYPO 18GX1.5 SHARP (NEEDLE) ×2
PACK CYSTO AR (MISCELLANEOUS) ×4 IMPLANT
PAD ARMBOARD 7.5X6 YLW CONV (MISCELLANEOUS) ×4 IMPLANT
SET CYSTO W/LG BORE CLAMP LF (SET/KITS/TRAYS/PACK) ×4 IMPLANT
SET IRRIG Y TYPE TUR BLADDER L (SET/KITS/TRAYS/PACK) ×4 IMPLANT
SOL .9 NS 3000ML IRR  AL (IV SOLUTION) ×8
SOL .9 NS 3000ML IRR UROMATIC (IV SOLUTION) ×8 IMPLANT
SURGILUBE 2OZ TUBE FLIPTOP (MISCELLANEOUS) ×4 IMPLANT
SYRINGE IRR TOOMEY STRL 70CC (SYRINGE) ×4 IMPLANT
WATER STERILE IRR 1000ML POUR (IV SOLUTION) ×4 IMPLANT

## 2020-05-13 NOTE — Discharge Instructions (Signed)
Transurethral Resection of Bladder Tumor (TURBT) or Bladder Biopsy ° ° °Definition: ° Transurethral Resection of the Bladder Tumor is a surgical procedure used to diagnose and remove tumors within the bladder. TURBT is the most common treatment for early stage bladder cancer. ° °General instructions: °   ° Your recent bladder surgery requires very little post hospital care but some definite precautions. ° °Despite the fact that no skin incisions were used, the area around the bladder incisions are raw and covered with scabs to promote healing and prevent bleeding. Certain precautions are needed to insure that the scabs are not disturbed over the next 2-4 weeks while the healing proceeds. ° °Because the raw surface inside your bladder and the irritating effects of urine you may expect frequency of urination and/or urgency (a stronger desire to urinate) and perhaps even getting up at night more often. This will usually resolve or improve slowly over the healing period. You may see some blood in your urine over the first 6 weeks. Do not be alarmed, even if the urine was clear for a while. Get off your feet and drink lots of fluids until clearing occurs. If you start to pass clots or don't improve call us. ° °Diet: ° °You may return to your normal diet immediately. Because of the raw surface of your bladder, alcohol, spicy foods, foods high in acid and drinks with caffeine may cause irritation or frequency and should be used in moderation. To keep your urine flowing freely and avoid constipation, drink plenty of fluids during the day (8-10 glasses). Tip: Avoid cranberry juice because it is very acidic. ° °Activity: ° °Your physical activity doesn't need to be restricted. However, if you are very active, you may see some blood in the urine. We suggest that you reduce your activity under the circumstances until the bleeding has stopped. ° °Bowels: ° °It is important to keep your bowels regular during the postoperative  period. Straining with bowel movements can cause bleeding. A bowel movement every other day is reasonable. Use a mild laxative if needed, such as milk of magnesia 2-3 tablespoons, or 2 Dulcolax tablets. Call if you continue to have problems. If you had been taking narcotics for pain, before, during or after your surgery, you may be constipated. Take a laxative if necessary. ° ° ° °Medication: ° °You should resume your pre-surgery medications unless told not to. In addition you may be given an antibiotic to prevent or treat infection. Antibiotics are not always necessary. All medication should be taken as prescribed until the bottles are finished unless you are having an unusual reaction to one of the drugs. ° ° °[facility] °Home, [place] [address] °[phone] ° ° ° °AMBULATORY SURGERY  °DISCHARGE INSTRUCTIONS ° ° °1) The drugs that you were given will stay in your system until tomorrow so for the next 24 hours you should not: ° °A) Drive an automobile °B) Make any legal decisions °C) Drink any alcoholic beverage ° ° °2) You may resume regular meals tomorrow.  Today it is better to start with liquids and gradually work up to solid foods. ° °You may eat anything you prefer, but it is better to start with liquids, then soup and crackers, and gradually work up to solid foods. ° ° °3) Please notify your doctor immediately if you have any unusual bleeding, trouble breathing, redness and pain at the surgery site, drainage, fever, or pain not relieved by medication. ° ° ° °4) Additional Instructions: ° ° ° ° ° ° ° °  Please contact your physician with any problems or Same Day Surgery at [phone], Monday through Friday 6 am to 4 pm, or [facility] number at [phone]. ° °

## 2020-05-13 NOTE — Anesthesia Postprocedure Evaluation (Signed)
Anesthesia Post Note  Patient: [name]  Procedure(s) Performed: TRANSURETHRAL RESECTION OF BLADDER TUMOR WITH gemcitabine (N/A Bladder) CYSTOSCOPY WITH RETROGRADE PYELOGRAM (Bilateral Ureter)  Patient location during evaluation: PACU Anesthesia Type: General Level of consciousness: awake and alert Pain management: pain level controlled Vital Signs Assessment: post-procedure vital signs reviewed and stable Respiratory status: spontaneous breathing and respiratory function stable Cardiovascular status: stable Anesthetic complications: no   No complications documented.   Last Vitals:  Vitals:   05/13/20 0942 05/13/20 0957  BP: (!) 135/97 (!) 148/86  Pulse: 67 (!) 59  Resp: 18 20  Temp:    SpO2: 100% 100%    Last Pain:  Vitals:   05/13/20 0957  TempSrc:   PainSc: 0-No pain                 Chrisy Hillebrand K

## 2020-05-13 NOTE — Transfer of Care (Signed)
Immediate Anesthesia Transfer of Care Note  Patient: Susan Medina  Procedure(s) Performed: TRANSURETHRAL RESECTION OF BLADDER TUMOR WITH gemcitabine (N/A Bladder) CYSTOSCOPY WITH RETROGRADE PYELOGRAM (Bilateral Ureter)  Patient Location: PACU  Anesthesia Type:General  Level of Consciousness: awake, alert  and oriented  Airway & Oxygen Therapy: Patient Spontanous Breathing and Patient connected to nasal cannula oxygen  Post-op Assessment: Report given to RN and Post -op Vital signs reviewed and stable  Post vital signs: Reviewed and stable  Last Vitals:  Vitals Value Taken Time  BP 129/88 05/13/20 0912  Temp    Pulse 67 05/13/20 0914  Resp 16 05/13/20 0914  SpO2 100 % 05/13/20 0914  Vitals shown include unvalidated device data.  Last Pain:  Vitals:   05/13/20 0912  TempSrc:   PainSc: (P) 0-No pain         Complications: No complications documented.

## 2020-05-13 NOTE — Op Note (Signed)
Date of procedure: 05/13/20  Preoperative diagnosis:  1. Bladder tumor  Postoperative diagnosis:  1. Same as above  Procedure: 1. TURBT, medium 2. Bilateral retrograde pyelogram 3. Instillation of intravesical gemcitabine  Surgeon: Hollice Espy, MD  Anesthesia: General  Complications: None  Intraoperative findings: Large approximately 2 cm anterior bladder neck tumor, nodular and clinically consistent with high-grade lesion.  New or previously unappreciated lesion measuring approximate 1.5 cm on the right posterior bladder wall.  There is also changes adjacent to but not involving the right UO as well as an area of velvety erythema concerning for CIS on the right lateral bladder wall.  EBL: Minimal  Specimens: Right posterior bladder wall tumor, bladder erythema, anterior bladder wall tumor  Drains: 16 French Foley catheter with 30 cc balloon  Indication: Susan Medina is a 74 y.o. patient with gross hematuria found to have nodularity in the bladder concerning for bladder cancer.  This was confirmed cystoscopically in the office..  After reviewing the management options for treatment, she elected to proceed with the above surgical procedure(s). We have discussed the potential benefits and risks of the procedure, side effects of the proposed treatment, the likelihood of the patient achieving the goals of the procedure, and any potential problems that might occur during the procedure or recuperation. Informed consent has been obtained.  Description of procedure:  The patient was taken to the operating room and general anesthesia was induced.  The patient was placed in the dorsal lithotomy position, prepped and draped in the usual sterile fashion, and preoperative antibiotics were administered. A preoperative time-out was performed.   A 21 French scope was advanced per urethra into the bladder.  The bladder was carefully inspected.  There are multiple areas of concern including a  large nodular high-grade appearing lesion on the anterior bladder wall measuring approximately 2 cm with friable overlying mucosa.  In addition to this, there is an area approximately 1.5 cm on the right posterior lateral bladder wall which was relatively broad-based and somewhat nodular concerning for a second discrete tumor.  Finally, there was some slight late texture rise change just adjacent to the right UO but not involving the UO itself concerning for another lesion.  There is a velvety erythematous change on the right lateral wall as well highly concerning for CIS.  At this point in time, attention was turned to the left UO which was cannulated using a 5 [place] open-ended ureteral catheter.  Gentle retrograde pyelogram on this side revealed no hydroureteronephrosis or filling defects.  The same procedure was performed on the right which is also unremarkable without hydroureteronephrosis or filling defects.  Next, the cystoscope was exchanged for a resectoscope, 26 [place].  A small bipolar loop was brought in using saline as the medium, the tumor on the right posterior bladder wall was resected.  These were passed off the field as right posterior bladder wall tumor.  Next, cold cup biopsy forceps were used to biopsy several representative areas of the velvety erythema and the area adjacent to the right UO was also included with this biopsy.  Bipolar loop was used to achieve hemostasis in these areas.  Finally, attention was turned to resection of the anterior bladder wall tumor which is the largest tumor.  Again, this tumor was nodular and relatively solid.  It was taken down to what appeared to be normal muscularis.  The chips were irrigated from the bladder and careful hemostasis was achieved.  Lastly, a flexible cystoscope was brought in  and retroflexed to ensure that the margins of the resection bed were appropriate and there was no residual visible tumor remaining.  Lastly, the bladder was  drained.  A 16 French Foley catheter was placed using 30 cc of sterile water in the balloon.  The patient was then cleaned and dried, repositioned in the supine position, reversed anesthesia, and taken the PACU in stable condition.  2000 mg of intravesical gemcitabine was instilled into the bladder and allowed to dwell in the PACU for an hour.  This was well-tolerated.  After an hour, the chemo was drained and the Foley was removed.  Plan: I will plan on calling this patient with her pathology next week and devise f/u plan based on these results.  Hollice Espy, M.D.

## 2020-05-13 NOTE — Anesthesia Preprocedure Evaluation (Addendum)
Anesthesia Evaluation  Patient identified by MRN, date of birth, ID band Patient awake    Reviewed: Allergy & Precautions, NPO status , Patient's Chart, lab work & pertinent test results  History of Anesthesia Complications Negative for: history of anesthetic complications  Airway Mallampati: III       Dental  (+) Edentulous Upper, Edentulous Lower   Pulmonary neg sleep apnea, neg COPD, former smoker (quit x 2 weeks),           Cardiovascular hypertension, Pt. on medications (-) Past MI and (-) CHF (-) dysrhythmias (-) Valvular Problems/Murmurs     Neuro/Psych neg Seizures (2 sz's 40 years ago, none since)    GI/Hepatic Neg liver ROS, GERD  ,  Endo/Other  neg diabetesHypothyroidism   Renal/GU negative Renal ROS     Musculoskeletal   Abdominal   Peds  Hematology   Anesthesia Other Findings   Reproductive/Obstetrics                            Anesthesia Physical Anesthesia Plan  ASA: III  Anesthesia Plan: General   Post-op Pain Management:    Induction: Intravenous  PONV Risk Score and Plan: 3 and Ondansetron, Dexamethasone and Treatment may vary due to age or medical condition  Airway Management Planned: Oral ETT  Additional Equipment:   Intra-op Plan:   Post-operative Plan:   Informed Consent: I have reviewed the patients History and Physical, chart, labs and discussed the procedure including the risks, benefits and alternatives for the proposed anesthesia with the patient or authorized representative who has indicated his/her understanding and acceptance.       Plan Discussed with:   Anesthesia Plan Comments:         Anesthesia Quick Evaluation

## 2020-05-13 NOTE — Interval H&P Note (Signed)
Patient seen and examined in the preoperative holding area.  No changes in history and physical today.  Regular rate and rhythm Clear to auscultation bilaterally  Consent was confirmed.  Additional questions answered.

## 2020-05-13 NOTE — Progress Notes (Signed)
   05/13/20 1200  Clinical Encounter Type  Visited With Patient  Visit Type Initial;Spiritual support  Referral From Chaplain  Consult/Referral To [place] visited with patient. Patient said "God sent you." She said her son and sister worry about her when she isn't sick, and am sure they will worry more because she is sick. Chaplain prayed for patient and patient's family. Patient alluded to God being in control and that she isn't worrying about anything. She is concerned but not worried.

## 2020-05-14 ENCOUNTER — Other Ambulatory Visit: Payer: Self-pay | Admitting: Anatomic Pathology & Clinical Pathology

## 2020-05-14 ENCOUNTER — Encounter: Payer: Self-pay | Admitting: Urology

## 2020-05-14 LAB — SURGICAL PATHOLOGY

## 2020-05-17 ENCOUNTER — Telehealth: Payer: Self-pay | Admitting: *Deleted

## 2020-05-17 NOTE — Telephone Encounter (Signed)
Patient called and informed of potential exposure to E-coli from [facility] [place] water supply during the time of recent procedure at [facility]. Patient advised to notify PCP if symptoms related to E coli develop such as nausea, vomiting, diarrhea, stomach cramping or pain. Pt verbalized understanding. No other concerns voiced at this time.   

## 2020-05-20 NOTE — Progress Notes (Signed)
05/21/2020 2:18 PM   Susan Medina 10/18/1946 [id number]  Referring provider: Baxter Hire, MD [place],  [place] [address] Chief Complaint  Patient presents with  . Follow-up    Post op results    HPI: Susan Medina is a 74 y.o. female with gross hematuria and a bladder tumor is seen today for follow up s/p TURBT 05/13/2020 and review of pathology.   Visited ED on 04/21/20 c/o hematuria onset morning of ED visit w/ associated sxs of urinary frequency and low back pain onset few days prior to ED visit. Her UA in ED was difficult to evaluate given multiple blood and species. CT imaging shows no evidence of stone however does show asymmetric abnormality of bladder wall. Discharged w/ Keflex 500 mg capsule.   Current smoker w/ a couple a day.   Patient underwent TURBT, B RTG, intravesical chemo on 05/13/2020. Pathology showed T1 urothelial carcinoma, high grade, with extensive invasion of lamina propria. Urothelial carcinoma in situ involving 4 of 4 fragments. There was focal superficial lamina propria invasion (1 mm) in 2 of 4 fragments. One fragment of muscularis propria was present and uninvolved. No muscularis propria present.   Patient feels good s/p TURBT on 05/13/20. She has some dark brown bleeding otherwise she is doing well.    PMH: Past Medical History:  Diagnosis Date  . Arthritis   . Cancer ([place])   . GERD (gastroesophageal reflux disease)    occ-no meds  . Hypertension   . Hypothyroidism     Surgical History: Past Surgical History:  Procedure Laterality Date  . CYSTOSCOPY W/ RETROGRADES Bilateral 05/13/2020   Procedure: CYSTOSCOPY WITH RETROGRADE PYELOGRAM;  Surgeon: Hollice Espy, MD;  Location: [facility] ORS;  Service: Urology;  Laterality: Bilateral;  . ECTOPIC PREGNANCY SURGERY    . TRANSURETHRAL RESECTION OF BLADDER TUMOR WITH MITOMYCIN-C N/A 05/13/2020   Procedure: TRANSURETHRAL RESECTION OF BLADDER TUMOR WITH gemcitabine;   Surgeon: Hollice Espy, MD;  Location: [facility] ORS;  Service: Urology;  Laterality: N/A;    Home Medications:  Allergies as of 05/21/2020   No Known Allergies     Medication List       Accurate as of May 21, 2020  2:18 PM. If you have any questions, ask your nurse or doctor.        aspirin 81 MG EC tablet Take 1 tablet (81 mg total) by mouth daily.   hydrochlorothiazide 25 MG tablet Commonly known as: HYDRODIURIL Take 1 tablet (25 mg total) by mouth daily.   levothyroxine 88 MCG tablet Commonly known as: SYNTHROID Take 1 tablet by mouth daily before breakfast.   lisinopril 10 MG tablet Commonly known as: ZESTRIL Take 1 tablet (10 mg total) by mouth daily.   oxybutynin 5 MG tablet Commonly known as: DITROPAN Take 1 tablet (5 mg total) by mouth every 8 (eight) hours as needed for bladder spasms.   phenazopyridine 200 MG tablet Commonly known as: Pyridium Take 1 tablet (200 mg total) by mouth 3 (three) times daily as needed for pain.   traMADol 50 MG tablet Commonly known as: Ultram Take 1 tablet (50 mg total) by mouth every 12 (twelve) hours as needed.   VITAMIN D PO Take 1 tablet by mouth daily.       Allergies: No Known Allergies  Family History: Family History  Problem Relation Age of Onset  . Diabetes Mother     Social History:  reports that she quit smoking about 5 weeks  ago. Her smoking use included cigarettes. She has a 25.00 pack-year smoking history. She has never used smokeless tobacco. She reports current drug use. Drug: Marijuana. She reports that she does not drink alcohol.   Physical Exam: BP (!) 159/88   Pulse 79   Ht 5\' 3"  (1.6 m)   Wt 143 lb (64.9 kg)   BMI 25.33 kg/m   Constitutional:  Alert and oriented, No acute distress. HEENT: [place] AT, moist mucus membranes.  Trachea midline, no masses. Cardiovascular: No clubbing, cyanosis, or edema. Respiratory: Normal respiratory effort, no increased work of breathing. Skin: No rashes, bruises  or suspicious lesions. Neurologic: Grossly intact, no focal deficits, moving all 4 extremities. Psychiatric: Normal mood and affect.  Laboratory Data:  Lab Results  Component Value Date   CREATININE 1.43 (H) 04/21/2020     Assessment & Plan:    1. Bladder Cancer (middle layer) -High grade Ta disease with CIS, small piece of muscle was identified and involved.  -Recommend TUR in OR with a 10% risk of upstaging -Dicussed induction of BCG along with the risk and benefits; informational brochure was provided.  -Patient has agreed.  -The patient has asked to have her sister Carlyon Shadow assist in coordinating furture appointments; however she does not want any of her prognosis to be discussed with her sister or any family member.     [facility] [address] [place], [place] [address] [phone]  I have reviewed the above documentation for accuracy and completeness, and I agree with the above.   Hollice Espy, MD  I spent 30 total minutes on the day of the encounter including pre-visit review of the medical record, face-to-face time with the patient, and post visit ordering of labs/imaging/tests.  Fransico Him, am acting as a scribe for Dr. Hollice Espy.

## 2020-05-21 ENCOUNTER — Other Ambulatory Visit: Payer: Self-pay

## 2020-05-21 ENCOUNTER — Ambulatory Visit: Payer: Medicare HMO | Admitting: Urology

## 2020-05-21 ENCOUNTER — Encounter: Payer: Self-pay | Admitting: Urology

## 2020-05-21 VITALS — BP 159/88 | HR 79 | Ht 63.0 in | Wt 143.0 lb

## 2020-05-21 DIAGNOSIS — D494 Neoplasm of unspecified behavior of bladder: Secondary | ICD-10-CM | POA: Diagnosis not present

## 2020-05-21 NOTE — Patient Instructions (Signed)

## 2020-05-25 ENCOUNTER — Other Ambulatory Visit: Payer: Self-pay | Admitting: Radiology

## 2020-05-25 DIAGNOSIS — C675 Malignant neoplasm of bladder neck: Secondary | ICD-10-CM

## 2020-05-25 DIAGNOSIS — D494 Neoplasm of unspecified behavior of bladder: Secondary | ICD-10-CM

## 2020-05-25 MED ORDER — GEMCITABINE CHEMO FOR BLADDER INSTILLATION 2000 MG: 2000.0000 mg | Freq: Once | INTRAVENOUS | Status: DC

## 2020-05-27 ENCOUNTER — Other Ambulatory Visit: Payer: Medicare HMO

## 2020-05-28 NOTE — Progress Notes (Signed)
Tumor Board Documentation  EDYE Medina was presented by Dr Erlene Quan at our Tumor Board on 05/27/2020, which included representatives from medical oncology, radiation oncology, surgical oncology, internal medicine, navigation, pathology, radiology, surgical, pharmacy, genetics, research, palliative care, pulmonology.  Susan Medina currently presents as an external consult, for new positive pathology, for Susan Medina [place] with history of the following treatments: surgical intervention(s), active survellience.  Additionally, we reviewed previous medical and familial history, history of present illness, and recent lab results along with all available histopathologic and imaging studies. The tumor board considered available treatment options and made the following recommendations: Surgery Induction BCG  The following procedures/referrals were also placed: No orders of the defined types were placed in this encounter.   Clinical Trial Status: not discussed   Staging used: AJCC Stage Group  AJCC Staging: T: 1     Group: Urothelial Bladder Cancer   National site-specific guidelines NCCN were discussed with respect to the case.  Tumor board is a meeting of clinicians from various specialty areas who evaluate and discuss patients for whom a multidisciplinary approach is being considered. Final determinations in the plan of care are those of the provider(s). The responsibility for follow up of recommendations given during tumor board is that of the provider.   Today's extended care, comprehensive team conference, [place] was not present for the discussion and was not examined.   Multidisciplinary Tumor Board is a multidisciplinary case peer review process.  Decisions discussed in the Multidisciplinary Tumor Board reflect the opinions of the specialists present at the conference without having examined the patient.  Ultimately, treatment and diagnostic decisions rest with the primary provider(s) and the patient.

## 2020-06-02 ENCOUNTER — Other Ambulatory Visit: Payer: Self-pay

## 2020-06-02 DIAGNOSIS — D494 Neoplasm of unspecified behavior of bladder: Secondary | ICD-10-CM

## 2020-06-04 ENCOUNTER — Other Ambulatory Visit: Payer: Self-pay

## 2020-06-04 ENCOUNTER — Other Ambulatory Visit: Payer: Medicare HMO

## 2020-06-04 DIAGNOSIS — R319 Hematuria, unspecified: Secondary | ICD-10-CM | POA: Diagnosis not present

## 2020-06-04 DIAGNOSIS — D494 Neoplasm of unspecified behavior of bladder: Secondary | ICD-10-CM

## 2020-06-04 LAB — URINALYSIS, COMPLETE
Bilirubin, UA: NEGATIVE
Glucose, UA: NEGATIVE
Ketones, UA: NEGATIVE
Nitrite, UA: NEGATIVE
Specific Gravity, UA: 1.02 (ref 1.005–1.030)
Urobilinogen, Ur: 0.2 mg/dL (ref 0.2–1.0)
pH, UA: 6.5 (ref 5.0–7.5)

## 2020-06-04 LAB — MICROSCOPIC EXAMINATION: WBC, UA: >30 /hpf — AB (ref 0–5)

## 2020-06-07 ENCOUNTER — Other Ambulatory Visit: Payer: Medicare HMO

## 2020-06-08 LAB — CULTURE, URINE COMPREHENSIVE

## 2020-06-09 ENCOUNTER — Telehealth: Payer: Self-pay | Admitting: Radiology

## 2020-06-09 DIAGNOSIS — N390 Urinary tract infection, site not specified: Secondary | ICD-10-CM

## 2020-06-09 DIAGNOSIS — D494 Neoplasm of unspecified behavior of bladder: Secondary | ICD-10-CM

## 2020-06-09 MED ORDER — NITROFURANTOIN MONOHYD MACRO 100 MG PO CAPS: 100.0000 mg | ORAL_CAPSULE | Freq: Two times a day (BID) | ORAL | 0 refills | Status: DC

## 2020-06-09 NOTE — Telephone Encounter (Signed)
Notified sister, Oval Linsey, of script sent to pharmacy.

## 2020-06-09 NOTE — Telephone Encounter (Signed)
-----   Message from Hollice Espy, MD sent at 06/09/2020  8:12 AM EDT ----- Please treat with Macrobid twice daily for total 7 days start today in anticipation of upcoming surgery.  Hollice Espy, MD

## 2020-06-10 ENCOUNTER — Other Ambulatory Visit
Admission: RE | Admit: 2020-06-10 | Discharge: 2020-06-10 | Disposition: A | Discharge status: Home / Self Care | Payer: Medicare HMO | Source: Ambulatory Visit | Attending: Urology | Admitting: Urology

## 2020-06-10 ENCOUNTER — Other Ambulatory Visit: Payer: Self-pay

## 2020-06-10 DIAGNOSIS — Z01812 Encounter for preprocedural laboratory examination: Secondary | ICD-10-CM | POA: Diagnosis not present

## 2020-06-10 DIAGNOSIS — Z20822 Contact with and (suspected) exposure to covid-19: Secondary | ICD-10-CM | POA: Diagnosis not present

## 2020-06-11 LAB — SARS CORONAVIRUS 2 (TAT 6-24 HRS): SARS Coronavirus 2: NEGATIVE

## 2020-06-14 ENCOUNTER — Encounter
Admission: RE | Disposition: A | Discharge status: Home / Self Care | Payer: Self-pay | Source: Home / Self Care | Attending: Urology

## 2020-06-14 ENCOUNTER — Ambulatory Visit
Admission: RE | Admit: 2020-06-14 | Discharge: 2020-06-14 | Disposition: A | Discharge status: Home / Self Care | Payer: Medicare HMO | Attending: Urology | Admitting: Urology

## 2020-06-14 ENCOUNTER — Other Ambulatory Visit: Payer: Self-pay | Admitting: Radiology

## 2020-06-14 DIAGNOSIS — Z5309 Procedure and treatment not carried out because of other contraindication: Secondary | ICD-10-CM | POA: Diagnosis not present

## 2020-06-14 DIAGNOSIS — C675 Malignant neoplasm of bladder neck: Secondary | ICD-10-CM

## 2020-06-14 DIAGNOSIS — D494 Neoplasm of unspecified behavior of bladder: Secondary | ICD-10-CM

## 2020-06-14 SURGERY — CYSTOSCOPY, WITH BIOPSY
Anesthesia: General

## 2020-06-14 MED ORDER — CEFAZOLIN SODIUM-DEXTROSE 1-4 GM/50ML-% IV SOLN: 1.0000 g | INTRAVENOUS | Status: DC

## 2020-06-14 MED ORDER — CHLORHEXIDINE GLUCONATE 0.12 % MT SOLN: 15.0000 mL | Freq: Once | OROMUCOSAL | Status: DC

## 2020-06-14 MED ORDER — ORAL CARE MOUTH RINSE: 15.0000 mL | Freq: Once | OROMUCOSAL | Status: DC

## 2020-06-14 MED ORDER — FAMOTIDINE 20 MG PO TABS: 20.0000 mg | ORAL_TABLET | Freq: Once | ORAL | Status: DC

## 2020-06-14 MED ORDER — LACTATED RINGERS IV SOLN: INTRAVENOUS | Status: DC

## 2020-06-14 MED ORDER — GEMCITABINE CHEMO FOR BLADDER INSTILLATION 2000 MG: 2000.0000 mg | Freq: Once | INTRAVENOUS | Status: DC

## 2020-06-14 SURGICAL SUPPLY — 18 items
BAG DRAIN CYSTO-URO LG1000N (MISCELLANEOUS) ×3 IMPLANT
BRUSH SCRUB EZ  4% CHG (MISCELLANEOUS) ×2
BRUSH SCRUB EZ 4% CHG (MISCELLANEOUS) ×1 IMPLANT
DRSG TELFA 4X3 1S NADH ST (GAUZE/BANDAGES/DRESSINGS) ×3 IMPLANT
ELECT REM PT RETURN 9FT ADLT (ELECTROSURGICAL) ×3
ELECTRODE REM PT RTRN 9FT ADLT (ELECTROSURGICAL) ×1 IMPLANT
GLOVE BIO SURGEON STRL SZ 6.5 (GLOVE) ×2 IMPLANT
GLOVE BIO SURGEONS STRL SZ 6.5 (GLOVE) ×1
GOWN STRL REUS W/ TWL LRG LVL3 (GOWN DISPOSABLE) ×2 IMPLANT
GOWN STRL REUS W/TWL LRG LVL3 (GOWN DISPOSABLE) ×4
KIT TURNOVER CYSTO (KITS) ×3 IMPLANT
NDL SAFETY ECLIPSE 18X1.5 (NEEDLE) ×1 IMPLANT
NEEDLE HYPO 18GX1.5 SHARP (NEEDLE) ×2
PACK CYSTO AR (MISCELLANEOUS) ×3 IMPLANT
SET CYSTO W/LG BORE CLAMP LF (SET/KITS/TRAYS/PACK) ×3 IMPLANT
SURGILUBE 2OZ TUBE FLIPTOP (MISCELLANEOUS) ×3 IMPLANT
WATER STERILE IRR 1000ML POUR (IV SOLUTION) ×3 IMPLANT
WATER STERILE IRR 3000ML UROMA (IV SOLUTION) ×3 IMPLANT

## 2020-06-14 NOTE — Progress Notes (Signed)
Pt states she accidentally ate a small reeses cup while in waiting room. Dr Ronelle Nigh and brandon aware, surgery will be rescheduled.

## 2020-06-17 ENCOUNTER — Other Ambulatory Visit: Payer: Self-pay

## 2020-06-17 ENCOUNTER — Other Ambulatory Visit
Admit: 2020-06-17 | Discharge: 2020-06-17 | Disposition: A | Discharge status: Home / Self Care | Payer: Medicare HMO | Attending: Urology | Admitting: Urology

## 2020-06-17 DIAGNOSIS — Z20822 Contact with and (suspected) exposure to covid-19: Secondary | ICD-10-CM | POA: Diagnosis not present

## 2020-06-17 DIAGNOSIS — Z01812 Encounter for preprocedural laboratory examination: Secondary | ICD-10-CM | POA: Diagnosis not present

## 2020-06-18 LAB — SARS CORONAVIRUS 2 (TAT 6-24 HRS): SARS Coronavirus 2: NEGATIVE

## 2020-06-21 ENCOUNTER — Encounter
Admission: RE | Disposition: A | Discharge status: Home / Self Care | Payer: Self-pay | Source: Home / Self Care | Attending: Urology

## 2020-06-21 ENCOUNTER — Ambulatory Visit
Admission: RE | Admit: 2020-06-21 | Discharge: 2020-06-21 | Disposition: A | Discharge status: Home / Self Care | Payer: Medicare HMO | Attending: Urology | Admitting: Urology

## 2020-06-21 ENCOUNTER — Ambulatory Visit: Payer: Medicare HMO | Admitting: Certified Registered Nurse Anesthetist

## 2020-06-21 ENCOUNTER — Other Ambulatory Visit: Payer: Self-pay

## 2020-06-21 ENCOUNTER — Encounter: Payer: Self-pay | Admitting: Urology

## 2020-06-21 DIAGNOSIS — Z833 Family history of diabetes mellitus: Secondary | ICD-10-CM | POA: Insufficient documentation

## 2020-06-21 DIAGNOSIS — D090 Carcinoma in situ of bladder: Secondary | ICD-10-CM | POA: Diagnosis not present

## 2020-06-21 DIAGNOSIS — K219 Gastro-esophageal reflux disease without esophagitis: Secondary | ICD-10-CM | POA: Diagnosis not present

## 2020-06-21 DIAGNOSIS — C678 Malignant neoplasm of overlapping sites of bladder: Secondary | ICD-10-CM | POA: Diagnosis not present

## 2020-06-21 DIAGNOSIS — R310 Gross hematuria: Secondary | ICD-10-CM | POA: Insufficient documentation

## 2020-06-21 DIAGNOSIS — D494 Neoplasm of unspecified behavior of bladder: Secondary | ICD-10-CM | POA: Diagnosis not present

## 2020-06-21 DIAGNOSIS — M1990 Unspecified osteoarthritis, unspecified site: Secondary | ICD-10-CM | POA: Diagnosis not present

## 2020-06-21 DIAGNOSIS — Z79899 Other long term (current) drug therapy: Secondary | ICD-10-CM | POA: Insufficient documentation

## 2020-06-21 DIAGNOSIS — I10 Essential (primary) hypertension: Secondary | ICD-10-CM | POA: Diagnosis not present

## 2020-06-21 DIAGNOSIS — C672 Malignant neoplasm of lateral wall of bladder: Secondary | ICD-10-CM | POA: Diagnosis not present

## 2020-06-21 DIAGNOSIS — C679 Malignant neoplasm of bladder, unspecified: Secondary | ICD-10-CM | POA: Diagnosis present

## 2020-06-21 DIAGNOSIS — C675 Malignant neoplasm of bladder neck: Secondary | ICD-10-CM

## 2020-06-21 DIAGNOSIS — E039 Hypothyroidism, unspecified: Secondary | ICD-10-CM | POA: Insufficient documentation

## 2020-06-21 DIAGNOSIS — Z87891 Personal history of nicotine dependence: Secondary | ICD-10-CM | POA: Diagnosis not present

## 2020-06-21 DIAGNOSIS — Z7982 Long term (current) use of aspirin: Secondary | ICD-10-CM | POA: Diagnosis not present

## 2020-06-21 HISTORY — PX: TRANSURETHRAL RESECTION OF BLADDER TUMOR WITH MITOMYCIN-C: SHX6459

## 2020-06-21 LAB — URINE DRUG SCREEN, QUALITATIVE (ARMC ONLY)
Amphetamines, Ur Screen: NOT DETECTED
Barbiturates, Ur Screen: NOT DETECTED
Benzodiazepine, Ur Scrn: NOT DETECTED
Cannabinoid 50 Ng, Ur ~~LOC~~: POSITIVE — AB
Cocaine Metabolite,Ur ~~LOC~~: NOT DETECTED
MDMA (Ecstasy)Ur Screen: NOT DETECTED
Methadone Scn, Ur: NOT DETECTED
Opiate, Ur Screen: NOT DETECTED
Phencyclidine (PCP) Ur S: NOT DETECTED
Tricyclic, Ur Screen: NOT DETECTED

## 2020-06-21 SURGERY — TRANSURETHRAL RESECTION OF BLADDER TUMOR WITH MITOMYCIN-C
Anesthesia: General

## 2020-06-21 MED ORDER — FENTANYL CITRATE (PF) 100 MCG/2ML IJ SOLN: 25.0000 ug | INTRAMUSCULAR | Status: DC | PRN

## 2020-06-21 MED ORDER — ORAL CARE MOUTH RINSE: 15.0000 mL | Freq: Once | OROMUCOSAL | Status: AC

## 2020-06-21 MED ORDER — CEFAZOLIN SODIUM-DEXTROSE 1-4 GM/50ML-% IV SOLN
INTRAVENOUS | Status: AC
  Filled 2020-06-21: qty 50

## 2020-06-21 MED ORDER — CHLORHEXIDINE GLUCONATE 0.12 % MT SOLN
15.0000 mL | Freq: Once | OROMUCOSAL | Status: AC
  Administered 2020-06-21: 15 mL via OROMUCOSAL

## 2020-06-21 MED ORDER — PHENYLEPHRINE HCL (PRESSORS) 10 MG/ML IV SOLN
INTRAVENOUS | Status: DC | PRN
  Administered 2020-06-21 (×2): 100 ug via INTRAVENOUS

## 2020-06-21 MED ORDER — ONDANSETRON HCL 4 MG/2ML IJ SOLN
INTRAMUSCULAR | Status: DC | PRN
  Administered 2020-06-21: 4 mg via INTRAVENOUS

## 2020-06-21 MED ORDER — GEMCITABINE CHEMO FOR BLADDER INSTILLATION 2000 MG
INTRAVENOUS | Status: DC | PRN
  Administered 2020-06-21: 2000 mg via INTRAVESICAL

## 2020-06-21 MED ORDER — FENTANYL CITRATE (PF) 100 MCG/2ML IJ SOLN
INTRAMUSCULAR | Status: DC | PRN
  Administered 2020-06-21 (×2): 50 ug via INTRAVENOUS

## 2020-06-21 MED ORDER — CHLORHEXIDINE GLUCONATE 0.12 % MT SOLN
OROMUCOSAL | Status: AC
  Filled 2020-06-21: qty 15

## 2020-06-21 MED ORDER — PROPOFOL 10 MG/ML IV BOLUS
INTRAVENOUS | Status: DC | PRN
  Administered 2020-06-21: 150 mg via INTRAVENOUS

## 2020-06-21 MED ORDER — CEFAZOLIN SODIUM-DEXTROSE 1-4 GM/50ML-% IV SOLN
1.0000 g | INTRAVENOUS | Status: AC
  Administered 2020-06-21: 1 g via INTRAVENOUS

## 2020-06-21 MED ORDER — FENTANYL CITRATE (PF) 100 MCG/2ML IJ SOLN
INTRAMUSCULAR | Status: AC
  Filled 2020-06-21: qty 2

## 2020-06-21 MED ORDER — SEVOFLURANE IN SOLN
RESPIRATORY_TRACT | Status: AC
  Filled 2020-06-21: qty 250

## 2020-06-21 MED ORDER — LACTATED RINGERS IV SOLN
INTRAVENOUS | Status: DC
  Administered 2020-06-21: 10 mL/h via INTRAVENOUS

## 2020-06-21 MED ORDER — ONDANSETRON HCL 4 MG/2ML IJ SOLN: 4.0000 mg | Freq: Once | INTRAMUSCULAR | Status: DC | PRN

## 2020-06-21 MED ORDER — SUCCINYLCHOLINE CHLORIDE 20 MG/ML IJ SOLN
INTRAMUSCULAR | Status: DC | PRN
  Administered 2020-06-21: 100 mg via INTRAVENOUS

## 2020-06-21 MED ORDER — LIDOCAINE HCL (CARDIAC) PF 100 MG/5ML IV SOSY
PREFILLED_SYRINGE | INTRAVENOUS | Status: DC | PRN
  Administered 2020-06-21: 60 mg via INTRAVENOUS

## 2020-06-21 MED ORDER — SUGAMMADEX SODIUM 200 MG/2ML IV SOLN
INTRAVENOUS | Status: DC | PRN
  Administered 2020-06-21: 129.8 mg via INTRAVENOUS

## 2020-06-21 MED ORDER — ROCURONIUM BROMIDE 100 MG/10ML IV SOLN
INTRAVENOUS | Status: DC | PRN
  Administered 2020-06-21: 10 mg via INTRAVENOUS

## 2020-06-21 MED ORDER — DEXAMETHASONE SODIUM PHOSPHATE 10 MG/ML IJ SOLN
INTRAMUSCULAR | Status: DC | PRN
  Administered 2020-06-21: 10 mg via INTRAVENOUS

## 2020-06-21 SURGICAL SUPPLY — 29 items
BAG DRAIN CYSTO-URO LG1000N (MISCELLANEOUS) ×3 IMPLANT
BAG URINE DRAIN 2000ML AR STRL (UROLOGICAL SUPPLIES) ×3 IMPLANT
BRUSH SCRUB EZ  4% CHG (MISCELLANEOUS) ×2
BRUSH SCRUB EZ 4% CHG (MISCELLANEOUS) ×1 IMPLANT
CATH FOLEY 2WAY  5CC 16FR (CATHETERS)
CATH FOLEY 2WAY SIL 16X30 (CATHETERS) ×3 IMPLANT
CATH URTH 16FR FL 2W BLN LF (CATHETERS) IMPLANT
DRAPE UTILITY 15X26 TOWEL STRL (DRAPES) ×3 IMPLANT
DRSG TELFA 4X3 1S NADH ST (GAUZE/BANDAGES/DRESSINGS) ×3 IMPLANT
ELECT LOOP 22F BIPOLAR SML (ELECTROSURGICAL) ×3
ELECT REM PT RETURN 9FT ADLT (ELECTROSURGICAL) ×3
ELECTRODE LOOP 22F BIPOLAR SML (ELECTROSURGICAL) ×1 IMPLANT
ELECTRODE REM PT RTRN 9FT ADLT (ELECTROSURGICAL) ×1 IMPLANT
GLOVE BIO SURGEON STRL SZ 6.5 (GLOVE) ×2 IMPLANT
GLOVE BIO SURGEONS STRL SZ 6.5 (GLOVE) ×1
GOWN STRL REUS W/ TWL LRG LVL3 (GOWN DISPOSABLE) ×2 IMPLANT
GOWN STRL REUS W/TWL LRG LVL3 (GOWN DISPOSABLE) ×4
IV NS IRRIG 3000ML ARTHROMATIC (IV SOLUTION) ×18 IMPLANT
KIT TURNOVER CYSTO (KITS) ×3 IMPLANT
LOOP CUT BIPOLAR 24F LRG (ELECTROSURGICAL) IMPLANT
NDL SAFETY ECLIPSE 18X1.5 (NEEDLE) ×1 IMPLANT
NEEDLE HYPO 18GX1.5 SHARP (NEEDLE) ×2
PACK CYSTO AR (MISCELLANEOUS) ×3 IMPLANT
PAD ARMBOARD 7.5X6 YLW CONV (MISCELLANEOUS) ×3 IMPLANT
SET IRRIG Y TYPE TUR BLADDER L (SET/KITS/TRAYS/PACK) ×3 IMPLANT
SURGILUBE 2OZ TUBE FLIPTOP (MISCELLANEOUS) ×3 IMPLANT
SYRINGE IRR TOOMEY STRL 70CC (SYRINGE) ×3 IMPLANT
WATER STERILE IRR 1000ML POUR (IV SOLUTION) ×3 IMPLANT
WATER STERILE IRR 3000ML UROMA (IV SOLUTION) ×6 IMPLANT

## 2020-06-21 NOTE — Op Note (Signed)
Date of procedure: 06/21/20  Preoperative diagnosis:  1. Bladder cancer of the anterior and right lateral bladder wall  Postoperative diagnosis:  1. Same as above  Procedure: 1. TURBT, medium 2. Instillation of intravesical chemotherapy  Surgeon: Hollice Espy, MD  Anesthesia: General  Complications: None  Intraoperative findings: Areas of necrosis from previous resection on the right lateral bladder wall and anterior bladder neck.  In addition to the previous, there was also a heaped up area extending from the right lateral bladder wall wrapping around towards the anterior bladder neck which initially was thought to be edema but upon resection, this was more consistent with solid tumor.  This was approximately 3 cm x 2 cm in diameter.  This was nodular, solid, and nonpapillary.  EBL: Minimal  Specimens: Anterior bladder tumor, right lateral bladder wall tumor  Drains: 16 French Foley catheter  Indication: Susan Medina is a 74 y.o. patient with at least high-grade T1 and CIS bladder cancer returns today for reresection.  After reviewing the management options for treatment, she elected to proceed with the above surgical procedure(s). We have discussed the potential benefits and risks of the procedure, side effects of the proposed treatment, the likelihood of the patient achieving the goals of the procedure, and any potential problems that might occur during the procedure or recuperation. Informed consent has been obtained.  Description of procedure:  The patient was taken to the operating room and general anesthesia was induced.  The patient was placed in the dorsal lithotomy position, prepped and draped in the usual sterile fashion, and preoperative antibiotics were administered. A preoperative time-out was performed.   10 French cystoscope was advanced per urethra into the bladder.  The bladder is carefully inspected.  There is areas of shaggy necrosis on the anterior bladder  neck as well as on the right lateral bladder wall consistent with previous resection sites.  There is no obvious papillary tumor.  On closer inspection, there is an area that was somewhat raised and nodular adjacent to the resection site on the right lateral bladder wall which wrapped around towards the anterior bladder neck.  There is some mild edema on the mucosa but there was a subtle hint that this was heaped up and nodular.  First, I took cold cup biopsy forceps and biopsied each of the previous resection sites you the areas of necrosis for deeper bites.  Visualization became relatively poor at this point in time.  This point time, elected to exchange the 21 French cystoscope for a resectoscope.  A 26 French resectoscope was used using a small bipolar loop.  The fluid was changed to normal saline.  I used the bipolar loop to begin resecting the nodular solid sheet of tumor which was somewhat more lateral wrapping around towards the anterior bladder wall.  This measured at least 3 cm x 2 cm.  Upon taking down his tumor, I got to an area that is more pinkish and recognizable in nature.  This was done until there is no suspicious tumor remaining however was difficult to appreciate the margin between tumor and nontumor in this situation.  I then used the bipolar loop to fulgurate the base of these areas to achieve excellent hemostasis.  The bladder tumor was evacuated from the bladder using a To[place]y syringe.  Finally, the bladder was drained.  A 16 French Foley catheter was placed using a 30 cc balloon.  She was then cleaned and dried, repositioned in supine position, reversed myesthesia, taken to  PACU in stable condition.  The patient was administered 2000 mg of intravesical gemcitabine which was allowed to dwell in the bladder for a total of 1 hour in the PACU.  After 1 hour, the chemotherapy was drained and the Foley catheter was removed.  Plan: I will  have her follow-up next week to discuss pathology  and management options.  Hollice Espy, M.D.

## 2020-06-21 NOTE — Transfer of Care (Signed)
Immediate Anesthesia Transfer of Care Note  Patient: Susan Medina  Procedure(s) Performed: TRANSURETHRAL RESECTION OF BLADDER TUMOR WITH gemcitabine (N/A )  Patient Location: PACU  Anesthesia Type:General  Level of Consciousness: awake, alert  and oriented  Airway & Oxygen Therapy: Patient Spontanous Breathing and Patient connected to face mask oxygen  Post-op Assessment: Report given to RN and Post -op Vital signs reviewed and stable  Post vital signs: Reviewed and stable  Last Vitals:  Vitals Value Taken Time  BP 155/84 06/21/20 1340  Temp    Pulse 69 06/21/20 1339  Resp 18 06/21/20 1343  SpO2 99 % 06/21/20 1339  Vitals shown include unvalidated device data.  Last Pain:  Vitals:   06/21/20 1147  PainSc: 0-No pain         Complications: No complications documented.

## 2020-06-21 NOTE — H&P (Signed)
06/21/20  Updated today, no change RRR CTAB   Susan Medina 1946-01-17 [id number]  Referring provider: Baxter Hire, MD [place],  [place] [address]     Chief Complaint  Patient presents with  . Follow-up    Post op results    HPI: Susan Medina is a 74 y.o. female with gross hematuria and a bladder tumor is seen today for follow up s/p TURBT 05/13/2020 and review of pathology.   Visited ED on 04/21/20 c/o hematuria onset morning of ED visit w/ associated sxs of urinary frequency and low back pain onset few days prior to ED visit.Her UA in ED was difficult to evaluate given multiple blood and species. CT imaging shows no evidence of stone however does show asymmetric abnormality of bladder wall. Discharged w/ Keflex 500 mg capsule.  Current smoker w/ a couple a day.  Patient underwent TURBT, B RTG, intravesical chemo on 05/13/2020. Pathology showed T1 urothelial carcinoma, high grade, with extensive invasion of lamina propria. Urothelial carcinoma in situ involving 4 of 4 fragments. There was focal superficial lamina propria invasion (1 mm) in 2 of 4 fragments. One fragment of muscularis propria was present and uninvolved. No muscularis propria present.   Patient feels good s/p TURBT on 05/13/20. She has some dark brown bleeding otherwise she is doing well.    PMH:     Past Medical History:  Diagnosis Date  . Arthritis   . Cancer ([place])   . GERD (gastroesophageal reflux disease)    occ-no meds  . Hypertension   . Hypothyroidism     Surgical History:      Past Surgical History:  Procedure Laterality Date  . CYSTOSCOPY W/ RETROGRADES Bilateral 05/13/2020   Procedure: CYSTOSCOPY WITH RETROGRADE PYELOGRAM;  Surgeon: Hollice Espy, MD;  Location: [facility] ORS;  Service: Urology;  Laterality: Bilateral;  . ECTOPIC PREGNANCY SURGERY    . TRANSURETHRAL RESECTION OF BLADDER TUMOR WITH MITOMYCIN-C N/A 05/13/2020   Procedure:  TRANSURETHRAL RESECTION OF BLADDER TUMOR WITH gemcitabine;  Surgeon: Hollice Espy, MD;  Location: [facility] ORS;  Service: Urology;  Laterality: N/A;    Home Medications:  Allergies as of 05/21/2020   No Known Allergies        Medication List       Accurate as of May 21, 2020  2:18 PM. If you have any questions, ask your nurse or doctor.        aspirin 81 MG EC tablet Take 1 tablet (81 mg total) by mouth daily.   hydrochlorothiazide 25 MG tablet Commonly known as: HYDRODIURIL Take 1 tablet (25 mg total) by mouth daily.   levothyroxine 88 MCG tablet Commonly known as: SYNTHROID Take 1 tablet by mouth daily before breakfast.   lisinopril 10 MG tablet Commonly known as: ZESTRIL Take 1 tablet (10 mg total) by mouth daily.   oxybutynin 5 MG tablet Commonly known as: DITROPAN Take 1 tablet (5 mg total) by mouth every 8 (eight) hours as needed for bladder spasms.   phenazopyridine 200 MG tablet Commonly known as: Pyridium Take 1 tablet (200 mg total) by mouth 3 (three) times daily as needed for pain.   traMADol 50 MG tablet Commonly known as: Ultram Take 1 tablet (50 mg total) by mouth every 12 (twelve) hours as needed.   VITAMIN D PO Take 1 tablet by mouth daily.       Allergies: No Known Allergies  Family History:      Family History  Problem  Relation Age of Onset  . Diabetes Mother     Social History:  reports that she quit smoking about 5 weeks ago. Her smoking use included cigarettes. She has a 25.00 pack-year smoking history. She has never used smokeless tobacco. She reports current drug use. Drug: Marijuana. She reports that she does not drink alcohol.   Physical Exam: BP (!) 159/88   Pulse 79   Ht 5\' 3"  (1.6 m)   Wt 143 lb (64.9 kg)   BMI 25.33 kg/m   Constitutional:  Alert and oriented, No acute distress. HEENT: [place] AT, moist mucus membranes.  Trachea midline, no masses. Cardiovascular: No clubbing, cyanosis, or  edema. Respiratory: Normal respiratory effort, no increased work of breathing. Skin: No rashes, bruises or suspicious lesions. Neurologic: Grossly intact, no focal deficits, moving all 4 extremities. Psychiatric: Normal mood and affect.  Laboratory Data:  Recent Labs       Lab Results  Component Value Date   CREATININE 1.43 (H) 04/21/2020       Assessment & Plan:    1. Bladder Cancer (middle layer) -High grade Ta disease with CIS, small piece of muscle was identified and involved.  -Recommend TUR in OR with a 10% risk of upstaging -Dicussed induction of BCG along with the risk and benefits; informational brochure was provided.  -Patient has agreed.  -The patient has asked to have her sister Carlyon Shadow assist in coordinating furture appointments; however she does not want any of her prognosis to be discussed with her sister or any family member.     [facility] [address] [place], [place] [address] [phone]  I have reviewed the above documentation for accuracy and completeness, and I agree with the above.   Hollice Espy, MD  I spent 30 total minutes on the day of the encounter including pre-visit review of the medical record, face-to-face time with the patient, and post visit ordering of labs/imaging/tests.  Fransico Him, am acting as a scribe for Dr. Hollice Espy.

## 2020-06-21 NOTE — Anesthesia Procedure Notes (Signed)
Procedure Name: Intubation Date/Time: 06/21/2020 12:27 PM Performed by: Willette Alma, CRNA Pre-anesthesia Checklist: Patient identified, Patient being monitored, Timeout performed, Emergency Drugs available and Suction available Patient Re-evaluated:Patient Re-evaluated prior to induction Oxygen Delivery Method: Circle system utilized Preoxygenation: Pre-oxygenation with 100% oxygen Induction Type: IV induction Ventilation: Mask ventilation without difficulty Laryngoscope Size: 3 and McGraph Grade View: Grade I Tube type: Oral Tube size: 7.0 mm Number of attempts: 1 Airway Equipment and Method: Stylet Placement Confirmation: ETT inserted through vocal cords under direct vision,  positive ETCO2 and breath sounds checked- equal and bilateral Secured at: 21 cm Tube secured with: Tape Dental Injury: Teeth and Oropharynx as per pre-operative assessment

## 2020-06-21 NOTE — Discharge Instructions (Signed)
AMBULATORY SURGERY  DISCHARGE INSTRUCTIONS   The drugs that you were given will stay in your system until tomorrow so for the next 24 hours you should not:  Drive an automobile Make any legal decisions Drink any alcoholic beverage   You may resume regular meals tomorrow.  Today it is better to start with liquids and gradually work up to solid foods.  You may eat anything you prefer, but it is better to start with liquids, then soup and crackers, and gradually work up to solid foods.   Please notify your doctor immediately if you have any unusual bleeding, trouble breathing, redness and pain at the surgery site, drainage, fever, or pain not relieved by medication.    Additional Instructions:         Please contact your physician with any problems or Same Day Surgery at [phone], Monday through Friday 6 am to 4 pm, or [place] [facility] number at [phone].Activity:  You are encouraged to ambulate frequently (about every hour during waking hours) to help prevent blood clots from forming in your legs or lungs.  However, you should not engage in any heavy lifting (> 5-10 lbs), strenuous activity, or straining.   Diet: You should advance your diet as instructed by your physician.  It will be normal to have some bloating, nausea, and abdominal discomfort intermittently.   Prescriptions:  You will be provided a prescription for pain medication to take as needed.  If your pain is not severe enough to require the prescription pain medication, you may take extra strength Tylenol instead which will have less side effects.  You should also take a prescribed stool softener to avoid straining with bowel movements as the prescription pain medication may constipate you.   Incisions: You may remove your dressing bandages 48 hours after surgery if not removed in the hospital.  You will either have some small staples or special tissue glue at each of the incision sites. Once the  bandages are removed (if present), the incisions may stay open to air.  You may start showering (but not soaking or bathing in water) the 2nd day after surgery and the incisions simply need to be patted dry after the shower.  No additional care is needed.  What to call us about: You should call the office if you develop fever > 101 or develop persistent vomiting, redness or draining around your incision, or any other concerning symptoms.    [facility] [address], [place], [place] [address] [phone]

## 2020-06-21 NOTE — Anesthesia Preprocedure Evaluation (Signed)
Anesthesia Evaluation  Patient identified by MRN, date of birth, ID band Patient awake    Reviewed: Allergy & Precautions, NPO status , Patient's Chart, lab work & pertinent test results  History of Anesthesia Complications Negative for: history of anesthetic complications  Airway Mallampati: III       Dental  (+) Edentulous Upper, Edentulous Lower, Dental Advidsory Given   Pulmonary neg pulmonary ROS, neg sleep apnea, neg COPD, former smoker,    Pulmonary exam normal        Cardiovascular hypertension, Pt. on medications (-) angina(-) Past MI and (-) CHF Normal cardiovascular exam(-) dysrhythmias (-) Valvular Problems/Murmurs     Neuro/Psych Seizures - (2 sz's 40 years ago, none since),  negative psych ROS   GI/Hepatic Neg liver ROS, GERD  ,  Endo/Other  neg diabetesHypothyroidism   Renal/GU negative Renal ROS     Musculoskeletal   Abdominal   Peds  Hematology   Anesthesia Other Findings Past Medical History: No date: Arthritis No date: Cancer ([place]) No date: GERD (gastroesophageal reflux disease)     Comment:  occ-no meds No date: Hypertension No date: Hypothyroidism   Reproductive/Obstetrics                             Anesthesia Physical  Anesthesia Plan  ASA: III  Anesthesia Plan: General   Post-op Pain Management:    Induction: Intravenous  PONV Risk Score and Plan: 3 and Ondansetron, Dexamethasone and Treatment may vary due to age or medical condition  Airway Management Planned: Oral ETT and LMA  Additional Equipment:   Intra-op Plan:   Post-operative Plan: Extubation in OR  Informed Consent: I have reviewed the patients History and Physical, chart, labs and discussed the procedure including the risks, benefits and alternatives for the proposed anesthesia with the patient or authorized representative who has indicated his/her understanding and acceptance.        Plan Discussed with:   Anesthesia Plan Comments:         Anesthesia Quick Evaluation

## 2020-06-22 ENCOUNTER — Encounter: Payer: Self-pay | Admitting: Urology

## 2020-06-22 LAB — SURGICAL PATHOLOGY

## 2020-06-22 NOTE — Anesthesia Postprocedure Evaluation (Signed)
Anesthesia Post Note  Patient: Susan Medina  Procedure(s) Performed: TRANSURETHRAL RESECTION OF BLADDER TUMOR WITH gemcitabine (N/A )  Patient location during evaluation: PACU Anesthesia Type: General Level of consciousness: awake and alert Pain management: pain level controlled Vital Signs Assessment: post-procedure vital signs reviewed and stable Respiratory status: spontaneous breathing, nonlabored ventilation, respiratory function stable and patient connected to nasal cannula oxygen Cardiovascular status: blood pressure returned to baseline and stable Postop Assessment: no apparent nausea or vomiting Anesthetic complications: no   No complications documented.   Last Vitals:  Vitals:   06/21/20 1441 06/21/20 1500  BP: 135/75 136/71  Pulse: 60 71  Resp: 15 16  Temp: 36.7 C 36.6 C  SpO2: 98% 98%    Last Pain:  Vitals:   06/21/20 1500  TempSrc: Temporal  PainSc: 0-No pain                 Martha Clan

## 2020-06-28 NOTE — Progress Notes (Signed)
06/30/2020 11:53 AM   Susan Medina 16-Mar-1946 [id number]  Referring provider: Baxter Hire, MD [place],   [address] Chief Complaint  Patient presents with  . Follow-up    HPI: Susan Medina is a 74 y.o. female who returns today to discuss pathology results s/p TURBT on 06/21/2020 and management options.   Visited ED on 04/21/20 c/o hematuria onset morning of ED visit w/ associated sxs of urinary frequency and low back pain onset few days prior to ED visit.Her UA in ED was difficult to evaluate given multiple blood and species. CT imaging shows no evidence of stone however does show asymmetric abnormality of bladder wall. Discharged w/ Keflex 500 mg capsule.  Patient underwent TURBT, B RTG, intravesical chemo on 05/13/2020. Pathology showed T1 urothelial carcinoma, high grade, with extensive invasion of lamina propria. Urothelial carcinoma in situ involving 4 of 4 fragments. There was focal superficial lamina propria invasion (1 mm) in 2 of 4 fragments. One fragment of muscularis propria was present and uninvolved. No muscularis propria present.  Patient underwent TURBT and intravesical chemotherapy on 06/21/2020. Pathology revealed urothelial carcinoma in situ, with marked underlying mixed inflammation. Negative for invasive carcinoma. No muscularis propria identified. Urothelial carcinoma in situ, focally suspicious for lamina propria. Invasion with extensive necrosis and mixed inflammation. Muscularis propria present and uninvolved.    She reports having some accidents when trying to make it to the bathroom. She has had some vagnial burning and itching. She reports that she is using vaginal cream for relief.   Current smoker w/ a couple a day.   PMH: Past Medical History:  Diagnosis Date  . Arthritis   . Cancer ([place])   . GERD (gastroesophageal reflux disease)    occ-no meds  . Hypertension   . Hypothyroidism     Surgical  History: Past Surgical History:  Procedure Laterality Date  . CYSTOSCOPY W/ RETROGRADES Bilateral 05/13/2020   Procedure: CYSTOSCOPY WITH RETROGRADE PYELOGRAM;  Surgeon: Hollice Espy, MD;  Location: [facility] ORS;  Service: Urology;  Laterality: Bilateral;  . ECTOPIC PREGNANCY SURGERY    . TRANSURETHRAL RESECTION OF BLADDER TUMOR WITH MITOMYCIN-C N/A 05/13/2020   Procedure: TRANSURETHRAL RESECTION OF BLADDER TUMOR WITH gemcitabine;  Surgeon: Hollice Espy, MD;  Location: [facility] ORS;  Service: Urology;  Laterality: N/A;  . TRANSURETHRAL RESECTION OF BLADDER TUMOR WITH MITOMYCIN-C N/A 06/21/2020   Procedure: TRANSURETHRAL RESECTION OF BLADDER TUMOR WITH gemcitabine;  Surgeon: Hollice Espy, MD;  Location: [facility] ORS;  Service: Urology;  Laterality: N/A;    Home Medications:  Allergies as of 06/30/2020   No Known Allergies     Medication List       Accurate as of June 30, 2020 11:59 PM. If you have any questions, ask your nurse or doctor.        STOP taking these medications   nitrofurantoin (macrocrystal-monohydrate) 100 MG capsule Commonly known as: MACROBID Stopped by: Hollice Espy, MD   oxybutynin 5 MG tablet Commonly known as: DITROPAN Stopped by: Hollice Espy, MD   phenazopyridine 200 MG tablet Commonly known as: Pyridium Stopped by: Hollice Espy, MD   traMADol 50 MG tablet Commonly known as: Ultram Stopped by: Hollice Espy, MD     TAKE these medications   aspirin 81 MG EC tablet Take 1 tablet (81 mg total) by mouth daily.   hydrochlorothiazide 25 MG tablet Commonly known as: HYDRODIURIL Take 1 tablet (25 mg total) by mouth daily.   levothyroxine 88 MCG tablet Commonly known  as: SYNTHROID Take 88 mcg by mouth daily before breakfast.   lisinopril 10 MG tablet Commonly known as: ZESTRIL Take 1 tablet (10 mg total) by mouth daily.   naproxen sodium 220 MG tablet Commonly known as: ALEVE Take 220 mg by mouth daily as needed (back pain).   VITAMIN D  PO Take 1 tablet by mouth 2 (two) times a week.       Allergies: No Known Allergies  Family History: Family History  Problem Relation Age of Onset  . Diabetes Mother     Social History:  reports that she quit smoking about 2 months ago. Her smoking use included cigarettes. She has a 25.00 pack-year smoking history. She has never used smokeless tobacco. She reports current drug use. Drug: Marijuana. She reports that she does not drink alcohol.   Physical Exam: BP (!) 142/86   Pulse 87   Constitutional:  Alert and oriented, No acute distress. HEENT: [place] AT, moist mucus membranes.  Trachea midline, no masses. Cardiovascular: No clubbing, cyanosis, or edema. Respiratory: Normal respiratory effort, no increased work of breathing. Skin: No rashes, bruises or suspicious lesions. Neurologic: Grossly intact, no focal deficits, moving all 4 extremities. Psychiatric: Normal mood and affect.  Laboratory Data:  Lab Results  Component Value Date   CREATININE 1.43 (H) 04/21/2020    Assessment & Plan:    1. Bladder cancer  -High grade Tc1 disease with CIS -No evidence of muscle invasion -Will recommend a aggressive cystectomy with urinary perversion vs bladder perversion with BCG. -Dicussed induction of BCG along with the risk and benefits; informational brochure was provided. -Patient has agreed.   -Plan to repeat bladder biopsy and bladder restaging imaging 3 month after treatment.  -All questions were answered.   BCG x 6 starting in about 3-4 weeks  [facility] [address] [place],  [address] [phone]  I, Selena Batten, am acting as a scribe for Dr. Hollice Espy.  I have reviewed the above documentation for accuracy and completeness, and I agree with the above.   Hollice Espy, MD

## 2020-06-30 ENCOUNTER — Encounter: Payer: Self-pay | Admitting: Urology

## 2020-06-30 ENCOUNTER — Ambulatory Visit: Payer: Medicare HMO | Admitting: Urology

## 2020-06-30 ENCOUNTER — Other Ambulatory Visit: Payer: Self-pay

## 2020-06-30 VITALS — BP 142/86 | HR 87

## 2020-06-30 DIAGNOSIS — D494 Neoplasm of unspecified behavior of bladder: Secondary | ICD-10-CM | POA: Diagnosis not present

## 2020-06-30 NOTE — Patient Instructions (Signed)
Bacillus Calmette-Guerin Live, BCG intravesical solution What is this medicine? BACILLUS CALMETTE-GUERIN LIVE, BCG (ba SIL us KAL met gay RAYN) is a bacteria solution. This medicine stimulates the immune system to ward off cancer cells. It is used to treat bladder cancer. This medicine may be used for other purposes; ask your health care provider or pharmacist if you have questions. COMMON BRAND NAME(S): Theracys, TICE BCG What should I tell my health care provider before I take this medicine? They need to know if you have any of these conditions:  aneurysm  blood in the urine  bladder biopsy within 2 weeks  fever or infection  immune system problems  leukemia  lymphoma  myasthenia gravis  need organ transplant  prosthetic device like arterial graft, artificial joint, prosthetic heart valve  recent or ongoing radiation therapy  tuberculosis  an unusual or allergic reaction to Bacillus Calmette-Guerin Live, BCG, latex, other medicines, foods, dyes, or preservatives  pregnant or trying to get pregnant  breast-feeding How should I use this medicine? This drug is given as a catheter infusion into the bladder. It is administered in a hospital or clinic by a specially trained health care provider. You will be given directions to follow before the treatment. Follow your health care provider's directions carefully. This medicine contains live bacteria. It is very important to follow these directions closely after treatment to prevent others from coming in contact with your urine. Your health care provider may give you additional directions to follow. Try to hold this medicine in your bladder for 1 to 2 hours. Follow these directions the first time you go to the bathroom and for 6 hours after the first void.  Wash your hands before using the restroom. After voiding, wash your hands and genital area.  Use a toilet and sit when going to the bathroom. This helps to prevent the urine  from splashing. Do not use public toilets or void outside.  After each void, add 2 cups of undiluted bleach to the toilet. Close the lid. Wait 15 to 20 minutes and then flush the toilet.  After the first void, drink more fluids to help dilute your urine.  If you have urinary incontinence, wash the clothes you were wearing in the washer immediately. Do not wash other clothes at the same time.  If you are wearing an incontinence pad, pour bleach on the pad and allow it to soak into the pad before throwing it away. Put the pad in a plastic bag and put it in the trash. Talk to your pediatrician regarding the use of this medicine in children. Special care may be needed. Overdosage: If you think you have taken too much of this medicine contact a poison control center or emergency room at once. NOTE: This medicine is only for you. Do not share this medicine with others. What if I miss a dose? It is important not to miss your dose. Call your doctor or health care professional if you are unable to keep an appointment. What may interact with this medicine?  antibiotics  medicines to suppress your immune system like chemotherapy agents or corticosteroids  medicine to treat tuberculosis This list may not describe all possible interactions. Give your health care provider a list of all the medicines, herbs, non-prescription drugs, or dietary supplements you use. Also tell them if you smoke, drink alcohol, or use illegal drugs. Some items may interact with your medicine. What should I watch for while using this medicine? Visit your health care   provider for checks on your progress. This medicine may make you feel generally unwell. Contact your health care provider if your symptoms last more than 2 days or if they get worse. Call your health care provider right away if you have a severe or unusual symptom. Infection can be spread to others through contact with this medicine. To prevent the spread of  infection, follow your health care provider's directions carefully after treatment. Do not become pregnant while taking this medicine. There is a potential for serious side effects to an unborn child. Talk to your health care provider for more information. Do not breast-feed an infant while taking this medicine. If you have sex while on this medicine, use a condom. Ask your health care provider how long you should use a condom. What side effects may I notice from receiving this medicine? Side effects that you should report to your doctor or health care professional as soon as possible:  allergic reactions like skin rash, itching or hives, swelling of the face, lips, or tongue  signs of infection - fever or chills, cough, sore throat, pain or difficulty passing urine  signs of decreased red blood cells - unusually weak or tired, fainting spells, lightheadedness  blood in urine  breathing problems  cough  eye pain, redness  flu-like symptoms  joint pain  bladder-area pain for more than 2 days after treatment  trouble passing urine or change in the amount of urine  vomiting  yellowing of the eyes or skin Side effects that usually do not require medical attention (report to your doctor or health care professional if they continue or are bothersome):  bladder spasm  burning when passing urine within 2 days of treatment  feel need to pass urine often or wake up at night to pass urine  loss of appetite This list may not describe all possible side effects. Call your doctor for medical advice about side effects. You may report side effects to FDA at 1-800-FDA-1088. Where should I keep my medicine? This drug is given in a hospital or clinic and will not be stored at home. NOTE: This sheet is a summary. It may not cover all possible information. If you have questions about this medicine, talk to your doctor, pharmacist, or health care provider.  2020 Elsevier/Gold Standard  (2018-12-20 12:05:29)  

## 2020-07-16 ENCOUNTER — Telehealth: Payer: Self-pay

## 2020-07-16 NOTE — Telephone Encounter (Signed)
-----   Message from Hollice Espy, MD sent at 07/01/2020 11:55 AM EDT ----- BCG x 6 starting in 3-4 weeks

## 2020-07-20 NOTE — Telephone Encounter (Signed)
Spoke with patient and went over detailed instructions of BCG treatments. Patient was given scheduled dates and times of treatments. Patient verbalized understanding

## 2020-08-06 ENCOUNTER — Ambulatory Visit: Payer: Medicare HMO | Admitting: Physician Assistant

## 2020-08-06 ENCOUNTER — Other Ambulatory Visit: Payer: Self-pay

## 2020-08-06 DIAGNOSIS — C675 Malignant neoplasm of bladder neck: Secondary | ICD-10-CM

## 2020-08-06 LAB — URINALYSIS, COMPLETE
Bilirubin, UA: NEGATIVE
Glucose, UA: NEGATIVE
Ketones, UA: NEGATIVE
Nitrite, UA: NEGATIVE
Specific Gravity, UA: 1.02 (ref 1.005–1.030)
Urobilinogen, Ur: 0.2 mg/dL (ref 0.2–1.0)
pH, UA: 6 (ref 5.0–7.5)

## 2020-08-06 LAB — MICROSCOPIC EXAMINATION: WBC, UA: >30 /hpf — AB (ref 0–5)

## 2020-08-06 MED ORDER — NITROFURANTOIN MONOHYD MACRO 100 MG PO CAPS: 100.0000 mg | ORAL_CAPSULE | Freq: Every day | ORAL | 0 refills | Status: DC

## 2020-08-06 MED ORDER — CIPROFLOXACIN HCL 250 MG PO TABS: 250.0000 mg | ORAL_TABLET | Freq: Two times a day (BID) | ORAL | 0 refills | Status: AC

## 2020-08-06 NOTE — Progress Notes (Signed)
Patient presented to clinic today for BCG induction #1 of 6.  UA grossly infected with >30 WBCs/hpf, 11-30 RBCs/hpf, and moderate bacteria. Last documented UA was similar, however culture grew cefuroxime-resistant Citrobacter.  On chart review, patient has grown Citrobacter multiple times in the past. I suspect she may be colonized. She denies dysuria, urgency, or frequency today. Will start empiric Cipro today, send urine for culture for further evaluation, and plan to start suppressive Macrobid for the duration of her BCG induction series once she completes Cipro treatment course.  Plan explained to the patient today; she expressed understanding.

## 2020-08-06 NOTE — Patient Instructions (Addendum)
Start Cipro (ciprofloxacin) today. You will take this two times every day for five days.  The day after you finish Cipro, start Macrobid (nitrofurantoin). You will take this one time every day for the next 6 weeks while you are getting your BCG treatments.  I will see you back in clinic next week for your first BCG treatment.

## 2020-08-10 LAB — CULTURE, URINE COMPREHENSIVE

## 2020-08-12 NOTE — Progress Notes (Signed)
BCG Bladder Instillation  BCG # 1/6  Due to Bladder Cancer patient is present today for a BCG treatment. Patient was cleaned and prepped in a sterile fashion with betadine. A 14 FR catheter was inserted, urine return was noted 10 mL, urine was yellow in color.  46ml of reconstituted BCG was instilled into the bladder. The catheter was then removed.  Patient stay with [place] for 30 minutes and no untoward side effects were noted.  Patient tolerated well, no complications were noted  Preformed by: Zara Council, PA-C and Kerman Passey, CMA  Follow up/ Additional notes: One week for # 2 BCG  Patient states that she has been noting a white discharge in her urine.  Urinalysis completed today noted trichomonas in her specimen.  When I informed the patient of our findings, she vehemently denied any sexual activities.   I explained to her that irregardless of her lack of sexual activity, I need to treat it as it is on the urinalysis and also based on her pelvic exam findings she is likely infected with trichomoniasis.   GU: Atrophic external genitalia, sparse pubic hair distribution, no lesions.  Normal urethral meatus, no lesions, no prolapse, no discharge.   No urethral masses, tenderness and/or tenderness.  No bladder fullness, tenderness or masses. Pale vagina mucosa, poor estrogen effect, copious white, frothy discharge is noted and no lesions.   I sent a prescription in for Flagyl 500 mg twice daily for 7 days.  I instructed patient to stop her daily nitrofurantoin while taking the Flagyl.  I also instructed her not to drink any alcoholic beverages while she is on the Flagyl.  She will return next week for her second BCG installment and of course we will recheck her urine at that time.

## 2020-08-13 ENCOUNTER — Other Ambulatory Visit: Payer: Self-pay

## 2020-08-13 ENCOUNTER — Ambulatory Visit (INDEPENDENT_AMBULATORY_CARE_PROVIDER_SITE_OTHER): Payer: Medicare HMO | Admitting: Urology

## 2020-08-13 DIAGNOSIS — C675 Malignant neoplasm of bladder neck: Secondary | ICD-10-CM | POA: Diagnosis not present

## 2020-08-13 DIAGNOSIS — A599 Trichomoniasis, unspecified: Secondary | ICD-10-CM | POA: Diagnosis not present

## 2020-08-13 LAB — URINALYSIS, COMPLETE
Bilirubin, UA: NEGATIVE
Glucose, UA: NEGATIVE
Ketones, UA: NEGATIVE
Nitrite, UA: NEGATIVE
Protein,UA: NEGATIVE
Specific Gravity, UA: 1.02 (ref 1.005–1.030)
Urobilinogen, Ur: 0.2 mg/dL (ref 0.2–1.0)
pH, UA: 6 (ref 5.0–7.5)

## 2020-08-13 LAB — MICROSCOPIC EXAMINATION
Epithelial Cells (non renal): >10 /hpf — AB (ref 0–10)
WBC, UA: >30 /hpf — AB (ref 0–5)

## 2020-08-13 MED ORDER — METRONIDAZOLE 500 MG PO TABS: 500.0000 mg | ORAL_TABLET | Freq: Two times a day (BID) | ORAL | 0 refills | Status: AC

## 2020-08-13 MED ORDER — BCG LIVE 50 MG IS SUSR
3.2400 mL | Freq: Once | INTRAVESICAL | Status: AC
  Administered 2020-08-13: 81 mg via INTRAVESICAL

## 2020-08-13 MED ORDER — METRONIDAZOLE 500 MG PO TABS: 500.0000 mg | ORAL_TABLET | Freq: Two times a day (BID) | ORAL | 0 refills | Status: DC

## 2020-08-13 NOTE — Addendum Note (Signed)
Addended by: Alvera Novel on: 08/13/2020 12:55 PM   Modules accepted: Orders

## 2020-08-13 NOTE — Patient Instructions (Addendum)
You need to stop the Macrobid (nitrofurantoin) and start the Flagyl (metronidazole) 500 mg twice daily for seven days.  Do not drink alcohol while you are taking this medication.  Once you finish the Flagyl (metronidazole), you can restart the Macrobid (nitrofurantoin) daily.    Patient Education: (BCG) Into the Bladder (Intravesical Chemotherapy)  BCG is a vaccine which is used to prevent tuberculosis (TB).  But it's also a helpful treatment for some early bladder cancers.  When BCG goes directly into the bladder the treatment is described as intravesical.  BCG is a type of immunotherapy.  Immunotherapy stimulates the body's immune system to destroy cancer cells.  How it's given BCG treatment is given to you in an outpatient setting.  It takes a few minutes to administer and you can go home as soon as it's finished.  It might be a good idea to ask someone to bring you, particularly the fist time.  Unlike chemotherapy into the bladder, BCG treatment is never given immediately after surgery to remove bladder tumors.  There needs to be a delay usually of at least two weeks after surgery, before you can have it.  You won't be given treatment with BCG if you are unwell or have an infection in your urine.  You're usually asked to limit the amount you drink before your treatment.  This will help to increase the concentration of BCG in your bladder.  Drinking too much before your treatment may make your bladder feel uncomfortably full.  If you normally take water tablets (diuretics) take them later in the day after your treatment.  Your nurse or doctor will give you more advise about preparing for your treatment.  You will have a small tube (catheter) placed into your bladder.  Your doctor will then put the liquid vaccine directly into your bladder through the catheter and remove the catheter.  You will need to hold your urine for two hours afterwards.  This can be difficult but it's to give the treatment  time to work.  You can walk around during this time.  When the treatment is over you can go to the toilet.  After your treatment there are some precautions you'll need to take.  This is because BCG is a live vaccine and other people shouldn't be exposed to it.  For the next six hours, you'll need to avoid your urine splashing on the toilet seat and getting any urine on your hands.  It might be easer for men to sit down when they're using an ordinary toilet although using a stand up urinal should be alright.  The main this is to avoid splashing urine and spreading the vaccine.  You will also be asked to put 1/2 cup undiluted bleach into the toilet to destroy any live vaccine and leave it for 15 minutes until you flush.  Side Effects Because BCG goes directly into the bladder most of the side effects are linked with the bladder.  They usually go away within one to two days after your treatment.  The most common ones are: -needing to pass urine often -pain when you pass urine -blood in urine -flu-like symptoms (tiredness, general aching and raised temperature)  Theses side effects should settle down within a day or two.  If they don't get better contact your doctor.  Drinking lots of fluids can help flush the drug out of your bladder and reduce some of these effects.  Taking Ibuprofen or Aleve is encouraged unless you have a  condition that would make these medications unsafe to take (renal failure, diabetes, gerd)  Rare side effects can include a continuing high temperature (fever), pain in your joints and a cough.  If you have any of these symptoms, or if you feel generally unwell, contact your doctor.  These symptoms could be a sign of a more serious infection (due to BCG) that needs to be treated immediately.  If this happens you'll be treated with the same drugs (antibiotics) that are used to treat TB.  Contraception Men should use a condom during sex for the first 48 hours after their treatment.   If you are a women who has had BCG treatment then your partner should use a condom.  Using a condom will protect your partner from any vaccine present in your semen or vaginal fluid.  We don't know how BCG may affect a developing fetus so it's not advisable to become pregnant or father a child while having it.  It is important to use effective contraception during your treatment and for six weeks afterwards.  You can discuss this with your doctor or specialist nurse.

## 2020-08-19 NOTE — Progress Notes (Signed)
BCG Bladder Instillation  BCG # 2/6  Due to Bladder Cancer patient is present today for a BCG treatment. Patient was cleaned and prepped in a sterile fashion with betadine. A 14 FR catheter was inserted, urine return was noted 50 mL, urine was yellow in color.  39ml of reconstituted BCG was instilled into the bladder. The catheter was then removed.  Patient tolerated well, no complications were noted   Patient is instructed to return to her residence and continue the [place].  She is also started to pour bleach stone the toilet for 6 hours after urinating today.  UA negative for trichomoniasis.    Preformed by: Zara Council, PA-C and Kerman Passey, CMA  Follow up/ Additional notes: One week for # 3 BCG

## 2020-08-20 ENCOUNTER — Ambulatory Visit (INDEPENDENT_AMBULATORY_CARE_PROVIDER_SITE_OTHER): Payer: Medicare HMO | Admitting: Urology

## 2020-08-20 ENCOUNTER — Other Ambulatory Visit: Payer: Self-pay

## 2020-08-20 DIAGNOSIS — A599 Trichomoniasis, unspecified: Secondary | ICD-10-CM | POA: Diagnosis not present

## 2020-08-20 DIAGNOSIS — C675 Malignant neoplasm of bladder neck: Secondary | ICD-10-CM | POA: Diagnosis not present

## 2020-08-20 MED ORDER — BCG LIVE 50 MG IS SUSR
3.2400 mL | Freq: Once | INTRAVESICAL | Status: AC
  Administered 2020-08-20: 81 mg via INTRAVESICAL

## 2020-08-21 LAB — URINALYSIS, COMPLETE
Bilirubin, UA: NEGATIVE
Glucose, UA: NEGATIVE
Ketones, UA: NEGATIVE
Nitrite, UA: NEGATIVE
Protein,UA: NEGATIVE
Specific Gravity, UA: 1.015 (ref 1.005–1.030)
Urobilinogen, Ur: 0.2 mg/dL (ref 0.2–1.0)
pH, UA: 6.5 (ref 5.0–7.5)

## 2020-08-21 LAB — MICROSCOPIC EXAMINATION
Bacteria, UA: NONE SEEN
WBC, UA: >30 /hpf — AB (ref 0–5)

## 2020-08-27 ENCOUNTER — Ambulatory Visit (INDEPENDENT_AMBULATORY_CARE_PROVIDER_SITE_OTHER): Payer: Medicare HMO | Admitting: Urology

## 2020-08-27 ENCOUNTER — Other Ambulatory Visit: Payer: Self-pay

## 2020-08-27 DIAGNOSIS — D494 Neoplasm of unspecified behavior of bladder: Secondary | ICD-10-CM

## 2020-08-27 DIAGNOSIS — R350 Frequency of micturition: Secondary | ICD-10-CM | POA: Diagnosis not present

## 2020-08-27 LAB — URINALYSIS, COMPLETE
Bilirubin, UA: NEGATIVE
Glucose, UA: NEGATIVE
Ketones, UA: NEGATIVE
Nitrite, UA: NEGATIVE
Specific Gravity, UA: 1.025 (ref 1.005–1.030)
Urobilinogen, Ur: 0.2 mg/dL (ref 0.2–1.0)
pH, UA: 6 (ref 5.0–7.5)

## 2020-08-27 LAB — MICROSCOPIC EXAMINATION
Epithelial Cells (non renal): >10 /hpf — AB (ref 0–10)
WBC, UA: >30 /hpf — AB (ref 0–5)

## 2020-08-27 NOTE — Progress Notes (Signed)
Patient presented today for BCG installment, but she has noted an increase in urinary frequency.  Therefore we will postpone today's BCG and send her urine for culture.  Her UA today is yellow cloudy, greater than 30 WBCs, 11-30 RBCs and few bacteria.

## 2020-09-01 LAB — CULTURE, URINE COMPREHENSIVE

## 2020-09-02 ENCOUNTER — Telehealth: Payer: Self-pay | Admitting: Family Medicine

## 2020-09-02 NOTE — Telephone Encounter (Signed)
-----   Message from Nori Riis, PA-C sent at 09/01/2020  4:47 PM EDT ----- Please let Mrs. Opie know that her urine culture was negative, so we do not need to prescribe an antibiotic.  We will see her on Friday for her BCG treatment

## 2020-09-02 NOTE — Telephone Encounter (Signed)
LMOM notified patient of results.

## 2020-09-02 NOTE — Progress Notes (Signed)
BCG Bladder Instillation  BCG # 3/6  Due to Bladder Cancer patient is present today for a BCG treatment. Patient was cleaned and prepped in a sterile fashion with betadine. A 14 FR catheter was inserted, urine return was noted 50 ml, urine was yellow in color.  55ml of reconstituted BCG was instilled into the bladder. The catheter was then removed. Patient tolerated well, no complications were noted  Performed by: Zara Council, PA-C and Kerman Passey, CMA  Follow up/ Additional notes: One week for #4/6 BCG

## 2020-09-03 ENCOUNTER — Other Ambulatory Visit: Payer: Self-pay

## 2020-09-03 ENCOUNTER — Ambulatory Visit (INDEPENDENT_AMBULATORY_CARE_PROVIDER_SITE_OTHER): Payer: Medicare HMO | Admitting: Urology

## 2020-09-03 DIAGNOSIS — D494 Neoplasm of unspecified behavior of bladder: Secondary | ICD-10-CM | POA: Diagnosis not present

## 2020-09-03 LAB — URINALYSIS, COMPLETE
Bilirubin, UA: NEGATIVE
Glucose, UA: NEGATIVE
Nitrite, UA: NEGATIVE
Specific Gravity, UA: 1.025 (ref 1.005–1.030)
Urobilinogen, Ur: 0.2 mg/dL (ref 0.2–1.0)
pH, UA: 7.5 (ref 5.0–7.5)

## 2020-09-03 LAB — MICROSCOPIC EXAMINATION
Bacteria, UA: NONE SEEN
WBC, UA: >30 /hpf — AB (ref 0–5)

## 2020-09-03 MED ORDER — BCG LIVE 50 MG IS SUSR
3.2400 mL | Freq: Once | INTRAVESICAL | Status: AC
  Administered 2020-09-03: 81 mg via INTRAVESICAL

## 2020-09-08 DIAGNOSIS — Z23 Encounter for immunization: Secondary | ICD-10-CM | POA: Diagnosis not present

## 2020-09-08 DIAGNOSIS — Z8551 Personal history of malignant neoplasm of bladder: Secondary | ICD-10-CM | POA: Diagnosis not present

## 2020-09-08 DIAGNOSIS — E038 Other specified hypothyroidism: Secondary | ICD-10-CM | POA: Diagnosis not present

## 2020-09-08 DIAGNOSIS — I10 Essential (primary) hypertension: Secondary | ICD-10-CM | POA: Diagnosis not present

## 2020-09-08 DIAGNOSIS — Z0000 Encounter for general adult medical examination without abnormal findings: Secondary | ICD-10-CM | POA: Diagnosis not present

## 2020-09-09 NOTE — Progress Notes (Signed)
BCG Bladder Instillation  BCG # 4/6  Due to Bladder Cancer patient is present today for a BCG treatment. Patient was cleaned and prepped in a sterile fashion with betadine. A 14 FR catheter was inserted, urine return was noted 30 ml, urine was yellow in color.  70ml of reconstituted BCG was instilled into the bladder. The catheter was then removed. She immediately voided.  We will need to place a catheter with a plug for her next BCG treatments.  I also have given her Gemtesa 75 mg, # 28 samples.  Performed by: Zara Council, PA-C and Kerman Passey, CMA  Follow up/ Additional notes: RTC in one week for #5/6 BCG

## 2020-09-10 ENCOUNTER — Ambulatory Visit (INDEPENDENT_AMBULATORY_CARE_PROVIDER_SITE_OTHER): Payer: Medicare HMO | Admitting: Urology

## 2020-09-10 ENCOUNTER — Other Ambulatory Visit: Payer: Self-pay

## 2020-09-10 DIAGNOSIS — D494 Neoplasm of unspecified behavior of bladder: Secondary | ICD-10-CM

## 2020-09-10 LAB — URINALYSIS, COMPLETE
Bilirubin, UA: NEGATIVE
Glucose, UA: NEGATIVE
Ketones, UA: NEGATIVE
Nitrite, UA: NEGATIVE
Specific Gravity, UA: 1.025 (ref 1.005–1.030)
Urobilinogen, Ur: 0.2 mg/dL (ref 0.2–1.0)
pH, UA: 7 (ref 5.0–7.5)

## 2020-09-10 LAB — MICROSCOPIC EXAMINATION
Bacteria, UA: NONE SEEN
Epithelial Cells (non renal): >10 /hpf — AB (ref 0–10)
WBC, UA: >30 /hpf — AB (ref 0–5)

## 2020-09-10 MED ORDER — GEMTESA 75 MG PO TABS: 75.0000 mg | ORAL_TABLET | Freq: Every day | ORAL | 0 refills | Status: DC

## 2020-09-10 MED ORDER — BCG LIVE 50 MG IS SUSR
3.2400 mL | Freq: Once | INTRAVESICAL | Status: AC
  Administered 2020-09-10: 81 mg via INTRAVESICAL

## 2020-09-16 NOTE — Progress Notes (Signed)
BCG Bladder Instillation  BCG # 5/6  Due to Bladder Cancer patient is present today for a BCG treatment. Patient was cleaned and prepped in a sterile fashion with betadine. A 16 FR catheter was inserted, urine return was noted 9 ml, urine was yellow in color.  7ml of reconstituted BCG was instilled into the bladder. The catheter was then removed. Patient tolerated well, no complications were noted  Performed by: Zara Council, PA-C and Daryel Gerald, CMA  Follow up/ Additional notes: One week for #6 BCG  Patient stated that she was taking her Gemtesa samples three times daily even though we instructed her to take them once a day.  She continues to have bladder spasms in spite of the excessive amount of the medication on board.    Poison control contacted.  They state to look for urinary retention.  I have check her CBC and CMP for further evaluation.   She also continues to have vaginal discharge and will refer to gynecology for further evaluation.

## 2020-09-17 ENCOUNTER — Encounter: Payer: Self-pay | Admitting: Urology

## 2020-09-17 ENCOUNTER — Other Ambulatory Visit: Payer: Self-pay

## 2020-09-17 ENCOUNTER — Ambulatory Visit (INDEPENDENT_AMBULATORY_CARE_PROVIDER_SITE_OTHER): Payer: Medicare HMO | Admitting: Urology

## 2020-09-17 VITALS — BP 184/100 | HR 84 | Ht 63.0 in | Wt 143.0 lb

## 2020-09-17 DIAGNOSIS — D494 Neoplasm of unspecified behavior of bladder: Secondary | ICD-10-CM | POA: Diagnosis not present

## 2020-09-17 DIAGNOSIS — N898 Other specified noninflammatory disorders of vagina: Secondary | ICD-10-CM | POA: Diagnosis not present

## 2020-09-17 DIAGNOSIS — Z79899 Other long term (current) drug therapy: Secondary | ICD-10-CM

## 2020-09-17 LAB — URINALYSIS, COMPLETE
Bilirubin, UA: NEGATIVE
Glucose, UA: NEGATIVE
Ketones, UA: NEGATIVE
Nitrite, UA: NEGATIVE
Specific Gravity, UA: >1.03 — ABNORMAL HIGH (ref 1.005–1.030)
Urobilinogen, Ur: 0.2 mg/dL (ref 0.2–1.0)
pH, UA: 6 (ref 5.0–7.5)

## 2020-09-17 LAB — MICROSCOPIC EXAMINATION
Bacteria, UA: NONE SEEN
WBC, UA: >30 /hpf — AB (ref 0–5)

## 2020-09-17 MED ORDER — BCG LIVE 50 MG IS SUSR
3.2400 mL | Freq: Once | INTRAVESICAL | Status: AC
  Administered 2020-09-17: 81 mg via INTRAVESICAL

## 2020-09-17 NOTE — Patient Instructions (Signed)
Do not take anymore of the [place].

## 2020-09-18 LAB — COMPREHENSIVE METABOLIC PANEL
ALT: 3 IU/L (ref 0–32)
AST: 14 IU/L (ref 0–40)
Albumin/Globulin Ratio: 1.2 (ref 1.2–2.2)
Albumin: 4.3 g/dL (ref 3.7–4.7)
Alkaline Phosphatase: 103 IU/L (ref 44–121)
BUN/Creatinine Ratio: 15 (ref 12–28)
BUN: 19 mg/dL (ref 8–27)
Bilirubin Total: 0.2 mg/dL (ref 0.0–1.2)
CO2: 19 mmol/L — ABNORMAL LOW (ref 20–29)
Calcium: 10 mg/dL (ref 8.7–10.3)
Chloride: 104 mmol/L (ref 96–106)
Creatinine, Ser: 1.28 mg/dL — ABNORMAL HIGH (ref 0.57–1.00)
GFR calc Af Amer: 47 mL/min/{1.73_m2} — ABNORMAL LOW (ref >59)
GFR calc non Af Amer: 41 mL/min/{1.73_m2} — ABNORMAL LOW (ref >59)
Globulin, Total: 3.7 g/dL (ref 1.5–4.5)
Glucose: 156 mg/dL — ABNORMAL HIGH (ref 65–99)
Potassium: 4.4 mmol/L (ref 3.5–5.2)
Sodium: 140 mmol/L (ref 134–144)
Total Protein: 8 g/dL (ref 6.0–8.5)

## 2020-09-18 LAB — CBC WITH DIFFERENTIAL/PLATELET
Basophils Absolute: 0.1 10*3/uL (ref 0.0–0.2)
Basos: 1 %
EOS (ABSOLUTE): 1.5 10*3/uL — ABNORMAL HIGH (ref 0.0–0.4)
Eos: 17 %
Hematocrit: 39.2 % (ref 34.0–46.6)
Hemoglobin: 13.3 g/dL (ref 11.1–15.9)
Immature Grans (Abs): 0 10*3/uL (ref 0.0–0.1)
Immature Granulocytes: 0 %
Lymphocytes Absolute: 1.7 10*3/uL (ref 0.7–3.1)
Lymphs: 21 %
MCH: 31.4 pg (ref 26.6–33.0)
MCHC: 33.9 g/dL (ref 31.5–35.7)
MCV: 93 fL (ref 79–97)
Monocytes Absolute: 0.5 10*3/uL (ref 0.1–0.9)
Monocytes: 6 %
Neutrophils Absolute: 4.7 10*3/uL (ref 1.4–7.0)
Neutrophils: 55 %
Platelets: 333 10*3/uL (ref 150–450)
RBC: 4.24 x10E6/uL (ref 3.77–5.28)
RDW: 13.4 % (ref 11.7–15.4)
WBC: 8.4 10*3/uL (ref 3.4–10.8)

## 2020-09-20 ENCOUNTER — Telehealth: Payer: Self-pay | Admitting: Family Medicine

## 2020-09-20 ENCOUNTER — Other Ambulatory Visit: Payer: Self-pay | Admitting: Family Medicine

## 2020-09-20 NOTE — Telephone Encounter (Signed)
-----   Message from Nori Riis, PA-C sent at 09/20/2020  8:13 AM EST ----- Please let Mrs. Lech know that her kidney function is somewhat diminished.  She needs to increase her water, do not take anymore of the [place] and we will recheck it in one month.

## 2020-09-20 NOTE — Telephone Encounter (Signed)
LMOM for patient to return call.

## 2020-09-22 NOTE — Telephone Encounter (Signed)
2nd attempt.     LMOM for patient to return call.

## 2020-09-24 ENCOUNTER — Ambulatory Visit: Payer: Self-pay | Admitting: Urology

## 2020-09-27 NOTE — Progress Notes (Signed)
BCG Bladder Instillation  BCG # 6/6  Due to Bladder Cancer patient is present today for a BCG treatment. Patient was cleaned and prepped in a sterile fashion with betadine. A 16 FR catheter was inserted, urine return was noted 30 ml, urine was yellow in color.  58ml of reconstituted BCG was instilled into the bladder. The catheter was then pluged. Patient tolerated well, no complications were noted  Performed by: Zara Council, PA-C and Daryel Gerald, CMA   Follow up/ Additional notes: Return for 12/21/2020 for surveillance cystoscopy with Dr. Erlene Quan

## 2020-09-28 ENCOUNTER — Encounter: Payer: Self-pay | Admitting: Urology

## 2020-09-28 ENCOUNTER — Ambulatory Visit (INDEPENDENT_AMBULATORY_CARE_PROVIDER_SITE_OTHER): Payer: Medicare HMO | Admitting: Urology

## 2020-09-28 ENCOUNTER — Other Ambulatory Visit: Payer: Self-pay

## 2020-09-28 DIAGNOSIS — D494 Neoplasm of unspecified behavior of bladder: Secondary | ICD-10-CM | POA: Diagnosis not present

## 2020-09-29 NOTE — Telephone Encounter (Signed)
Patient was notified at her office visit.

## 2020-09-30 ENCOUNTER — Telehealth: Payer: Self-pay | Admitting: Obstetrics & Gynecology

## 2020-09-30 NOTE — Telephone Encounter (Signed)
[facility] referring for vaginal discharge. Called and left voicemail for patient to call back to be scheduled.

## 2020-10-01 LAB — MICROSCOPIC EXAMINATION
RBC, Urine: >30 /hpf — AB (ref 0–2)
WBC, UA: >30 /hpf — AB (ref 0–5)

## 2020-10-01 LAB — URINALYSIS, COMPLETE
Bilirubin, UA: NEGATIVE
Glucose, UA: NEGATIVE
Ketones, UA: NEGATIVE
Nitrite, UA: NEGATIVE
Specific Gravity, UA: 1.02 (ref 1.005–1.030)
Urobilinogen, Ur: 0.2 mg/dL (ref 0.2–1.0)
pH, UA: 6 (ref 5.0–7.5)

## 2020-10-06 ENCOUNTER — Encounter: Payer: Self-pay | Admitting: Obstetrics and Gynecology

## 2020-10-14 ENCOUNTER — Encounter: Payer: Self-pay | Admitting: Obstetrics and Gynecology

## 2020-10-14 ENCOUNTER — Other Ambulatory Visit: Payer: Self-pay

## 2020-10-14 ENCOUNTER — Ambulatory Visit (INDEPENDENT_AMBULATORY_CARE_PROVIDER_SITE_OTHER): Payer: Medicare HMO | Admitting: Obstetrics and Gynecology

## 2020-10-14 ENCOUNTER — Other Ambulatory Visit (HOSPITAL_COMMUNITY)
Admission: RE | Admit: 2020-10-14 | Discharge: 2020-10-14 | Disposition: A | Discharge status: Home / Self Care | Payer: Medicare HMO | Source: Ambulatory Visit | Attending: Obstetrics and Gynecology | Admitting: Obstetrics and Gynecology

## 2020-10-14 VITALS — BP 120/80 | Ht 62.0 in | Wt 143.0 lb

## 2020-10-14 DIAGNOSIS — A5901 Trichomonal vulvovaginitis: Secondary | ICD-10-CM

## 2020-10-15 ENCOUNTER — Encounter: Payer: Self-pay | Admitting: Obstetrics and Gynecology

## 2020-10-15 NOTE — Progress Notes (Signed)
Patient ID: Susan Medina, female   DOB: [birth year], 74 y.o.   MRN: [id number]  Reason for Consult: Vaginal Discharge   Referred by Baxter Hire, MD  Subjective:     HPI:  Susan Medina is a 74 y.o. female who presents to today as a referral from urologist for vaginal discharge. Today patient reports that she was treated for an "infection" within the last two months that was noted during treatment for bladder cancer. Patient reports completing antibiotic course as prescribed by provider at that time. Patient states she has not been sexually active since the "90s" and is not sure what infection could be related to. She reports pelvic tenderness related to recent bladder cancer treatment and that her bladder has been "loose" since undergoing treatment. Today, patient denies vaginal discharge, itching, or irritation.   Gynecological History  Menopause: Unsure - "years ago" LMP: Unsure Last pap smear: Unknown Last Mammogram: Unknown History of STDs: yes Sexually Active: Not since the 90s  Obstetrical History [id number]  Past Medical History:  Diagnosis Date   Arthritis    Cancer ([place])    GERD (gastroesophageal reflux disease)    occ-no meds   Hypertension    Hypothyroidism    Family History  Problem Relation Age of Onset   Diabetes Mother    Past Surgical History:  Procedure Laterality Date   CYSTOSCOPY W/ RETROGRADES Bilateral 05/13/2020   Procedure: CYSTOSCOPY WITH RETROGRADE PYELOGRAM;  Surgeon: Hollice Espy, MD;  Location: [facility] ORS;  Service: Urology;  Laterality: Bilateral;   ECTOPIC PREGNANCY SURGERY     TRANSURETHRAL RESECTION OF BLADDER TUMOR WITH MITOMYCIN-C N/A 05/13/2020   Procedure: TRANSURETHRAL RESECTION OF BLADDER TUMOR WITH gemcitabine;  Surgeon: Hollice Espy, MD;  Location: [facility] ORS;  Service: Urology;  Laterality: N/A;   TRANSURETHRAL RESECTION OF BLADDER TUMOR WITH MITOMYCIN-C N/A 06/21/2020   Procedure: TRANSURETHRAL RESECTION OF  BLADDER TUMOR WITH gemcitabine;  Surgeon: Hollice Espy, MD;  Location: [facility] ORS;  Service: Urology;  Laterality: N/A;    Short Social History:  Social History   Tobacco Use   Smoking status: Former Smoker    Packs/day: 0.50    Years: 50.00    Pack years: 25.00    Types: Cigarettes    Quit date: 04/14/2020    Years since quitting: 0.5   Smokeless tobacco: Never Used  Substance Use Topics   Alcohol use: No    No Known Allergies  Current Outpatient Medications  Medication Sig Dispense Refill   aspirin EC 81 MG EC tablet Take 1 tablet (81 mg total) by mouth daily. 30 tablet 0   hydrochlorothiazide (HYDRODIURIL) 25 MG tablet Take 1 tablet (25 mg total) by mouth daily. 30 tablet 1   levothyroxine (SYNTHROID, LEVOTHROID) 88 MCG tablet Take 88 mcg by mouth daily before breakfast.      lisinopril (PRINIVIL,ZESTRIL) 10 MG tablet Take 1 tablet (10 mg total) by mouth daily. 30 tablet 0   naproxen sodium (ALEVE) 220 MG tablet Take 220 mg by mouth daily as needed (back pain).     nitrofurantoin (MACRODANTIN) 100 MG capsule Take by mouth.     Vibegron (GEMTESA) 75 MG TABS Take 75 mg by mouth daily. 28 tablet 0   VITAMIN D PO Take 1 tablet by mouth 2 (two) times a week.      Current Facility-Administered Medications  Medication Dose Route Frequency Provider Last Rate Last Admin   gemcitabine (GEMZAR) chemo syringe for bladder instillation 2,000 mg  2,000 mg Bladder  Instillation Once Hollice Espy, MD       gemcitabine [facility]) chemo syringe for bladder instillation 2,000 mg  2,000 mg Bladder Instillation Once Hollice Espy, MD       gemcitabine [facility]) chemo syringe for bladder instillation 2,000 mg  2,000 mg Bladder Instillation Once Hollice Espy, MD        Review of Systems  All other systems reviewed and are negative       Objective:  Objective   Vitals:   10/14/20 1319  BP: 120/80  Weight: 143 lb (64.9 kg)  Height: 5\' 2"  (1.575 m)   Body mass index is  26.16 kg/m.  Physical Exam Constitutional:      Appearance: Normal appearance.  HENT:     Head: Normocephalic.  Cardiovascular:     Rate and Rhythm: Normal rate.  Pulmonary:     Effort: Pulmonary effort is normal.  Neurological:     Mental Status: She is alert and oriented to person, place, and time.  Psychiatric:        Mood and Affect: Mood normal.        Behavior: Behavior normal.    Patient declined pelvic exam due to discomfort associated with recent bladder cancer treatment.   Labs:  Trichomonas present in urine on 08/13/20  Assessment/Plan:    74 yo presents for f/u after treatment for trichomonas by referring provider. Today patient denies vaginal discharge and declines pelvic exam. Patient desires to self-swab for further STI screening including GC/CT/trich.  -educated on self-collection  -aptima swab collected  -Patient desires follow-up for full pelvic exam after completion of treatment for bladder cancer  -F/u as needed - will call with results     Orlie Pollen, [place], [facility] 10/15/2020 11:01 AM

## 2020-10-18 LAB — CERVICOVAGINAL ANCILLARY ONLY
Bacterial Vaginitis (gardnerella): POSITIVE — AB
Candida Glabrata: NEGATIVE
Candida Vaginitis: NEGATIVE
Chlamydia: NEGATIVE
Comment: NEGATIVE
Comment: NEGATIVE
Comment: NEGATIVE
Comment: NEGATIVE
Comment: NEGATIVE
Comment: NORMAL
Neisseria Gonorrhea: NEGATIVE
Trichomonas: NEGATIVE

## 2020-12-21 ENCOUNTER — Other Ambulatory Visit: Payer: Self-pay | Admitting: Urology

## 2020-12-21 ENCOUNTER — Telehealth: Payer: Self-pay | Admitting: Urology

## 2020-12-21 NOTE — Telephone Encounter (Signed)
Please call and check on her and reschedule for next available cysto.  Hollice Espy, MD

## 2020-12-21 NOTE — Telephone Encounter (Signed)
Pt was a no show for cysto today.  Just F.Y.I. 

## 2020-12-22 ENCOUNTER — Encounter: Payer: Self-pay | Admitting: Urology

## 2020-12-23 NOTE — Telephone Encounter (Signed)
After 3rd attempt to reach pt. I LMOM for pt. To call office and reschedule missed appointment. I will continue to reach out to pt. this week.

## 2021-05-17 ENCOUNTER — Other Ambulatory Visit: Payer: Self-pay

## 2021-05-17 ENCOUNTER — Ambulatory Visit: Payer: Medicare HMO | Admitting: Urology

## 2021-05-17 ENCOUNTER — Encounter: Payer: Self-pay | Admitting: Urology

## 2021-05-17 VITALS — BP 149/80 | HR 74 | Ht 62.0 in | Wt 143.0 lb

## 2021-05-17 DIAGNOSIS — Z8551 Personal history of malignant neoplasm of bladder: Secondary | ICD-10-CM | POA: Diagnosis not present

## 2021-05-17 DIAGNOSIS — D494 Neoplasm of unspecified behavior of bladder: Secondary | ICD-10-CM | POA: Diagnosis not present

## 2021-05-17 LAB — URINALYSIS, COMPLETE
Bilirubin, UA: NEGATIVE
Glucose, UA: NEGATIVE
Ketones, UA: NEGATIVE
Leukocytes,UA: NEGATIVE
Nitrite, UA: NEGATIVE
Protein,UA: NEGATIVE
Specific Gravity, UA: 1.01 (ref 1.005–1.030)
Urobilinogen, Ur: 0.2 mg/dL (ref 0.2–1.0)
pH, UA: 5.5 (ref 5.0–7.5)

## 2021-05-17 LAB — MICROSCOPIC EXAMINATION: RBC, Urine: NONE SEEN /hpf (ref 0–2)

## 2021-05-17 MED ORDER — DIAZEPAM 10 MG PO TABS: 10.0000 mg | ORAL_TABLET | Freq: Four times a day (QID) | ORAL | 0 refills | Status: AC | PRN

## 2021-05-17 NOTE — Progress Notes (Signed)
   05/17/21  CC:  Chief Complaint  Patient presents with   Cysto    HPI: Susan Medina is a 75 y.o. female who returns today for surveillance cystoscopy.  She is overdue for this, no showed in February.  She reports that she had a death in her family and has been in out of town.   Patient underwent TURBT, B RTG, intravesical chemo on 05/13/2020. Pathology showed T1 urothelial carcinoma, high grade, with extensive invasion of lamina propria. Urothelial carcinoma in situ involving 4 of 4 fragments. There was focal superficial lamina propria invasion (1 mm) in 2 of 4 fragments. One fragment of muscularis propria was present and uninvolved. No muscularis propria present.   Patient underwent TURBT and intravesical chemotherapy on 06/21/2020. Pathology revealed urothelial carcinoma in situ, with marked underlying mixed inflammation. Negative for invasive carcinoma. No muscularis propria identified. Urothelial carcinoma in situ, focally suspicious for lamina propria. Invasion with extensive necrosis and mixed inflammation. Muscularis propria present and uninvolved.    Completed BCG x 6   08/2021.  Blood pressure (!) 149/80, pulse 74, height 5\' 2"  (1.575 m), weight 143 lb (64.9 kg). NED. A&Ox3.   No respiratory distress   Abd soft, NT, ND Normal external genitalia with patent urethral meatus  Cystoscopy Procedure Note  Patient identification was confirmed, informed consent was obtained, and patient was prepped using Betadine solution.  Lidocaine jelly was administered per urethral meatus.    Procedure: - Flexible cystoscope introduced, without any difficulty.   - Thorough search of the bladder revealed:    normal urethral meatus    normal urothelium with multifocal stellate scars including adjacent to the bladder neck    no stones    no ulcers     no tumors    no urethral polyps    no trabeculation  - Ureteral orifices were normal in position and appearance.  Post-Procedure: -  Patient tolerated the procedure well  Assessment/ Plan:  1. History of bladder cancer History of high-grade T1 TCC with CIS status post induction BCG  Cystoscopy today is very reassuring  Given her history of high risk disease, would recommend repeat upper tract imaging in the form of CT urogram.  She is very hesitant to want a proceed with this secondary to extreme claustrophobia.  We discussed trying this with anxiolytic.  She may or may not go through with it but was strongly encouraged to pursue this.  We also discussed the role of maintenance BCG which I do think that she would benefit from.  She agrees to doing this but does not want to for at least 3 months because she will be traveling.  Will plan for cystoscopy again in 3 months and BCG thereafter  Urine cytology today - Urinalysis, Complete   Hollice Espy, MD

## 2021-05-27 ENCOUNTER — Encounter: Payer: Self-pay | Admitting: Emergency Medicine

## 2021-05-27 ENCOUNTER — Other Ambulatory Visit: Payer: Self-pay

## 2021-05-27 ENCOUNTER — Emergency Department
Admission: EM | Admit: 2021-05-27 | Discharge: 2021-05-27 | Disposition: A | Discharge status: Home / Self Care | Payer: Medicare HMO | Attending: Emergency Medicine | Admitting: Emergency Medicine

## 2021-05-27 DIAGNOSIS — Z859 Personal history of malignant neoplasm, unspecified: Secondary | ICD-10-CM | POA: Diagnosis not present

## 2021-05-27 DIAGNOSIS — Z7982 Long term (current) use of aspirin: Secondary | ICD-10-CM | POA: Insufficient documentation

## 2021-05-27 DIAGNOSIS — Z79899 Other long term (current) drug therapy: Secondary | ICD-10-CM | POA: Diagnosis not present

## 2021-05-27 DIAGNOSIS — R509 Fever, unspecified: Secondary | ICD-10-CM | POA: Insufficient documentation

## 2021-05-27 DIAGNOSIS — I10 Essential (primary) hypertension: Secondary | ICD-10-CM | POA: Diagnosis not present

## 2021-05-27 DIAGNOSIS — E038 Other specified hypothyroidism: Secondary | ICD-10-CM | POA: Insufficient documentation

## 2021-05-27 DIAGNOSIS — Z20822 Contact with and (suspected) exposure to covid-19: Secondary | ICD-10-CM | POA: Insufficient documentation

## 2021-05-27 DIAGNOSIS — Z87891 Personal history of nicotine dependence: Secondary | ICD-10-CM | POA: Insufficient documentation

## 2021-05-27 DIAGNOSIS — M791 Myalgia, unspecified site: Secondary | ICD-10-CM | POA: Diagnosis not present

## 2021-05-27 LAB — URINALYSIS, COMPLETE (UACMP) WITH MICROSCOPIC
Bilirubin Urine: NEGATIVE
Glucose, UA: NEGATIVE mg/dL
Ketones, ur: 5 mg/dL — AB
Nitrite: NEGATIVE
Protein, ur: 100 mg/dL — AB
Specific Gravity, Urine: 1.017 (ref 1.005–1.030)
WBC, UA: >50 WBC/hpf — ABNORMAL HIGH (ref 0–5)
pH: 5 (ref 5.0–8.0)

## 2021-05-27 LAB — RESP PANEL BY RT-PCR (FLU A&B, COVID) ARPGX2
Influenza A by PCR: NEGATIVE
Influenza B by PCR: NEGATIVE
SARS Coronavirus 2 by RT PCR: NEGATIVE

## 2021-05-27 MED ORDER — CEFDINIR 300 MG PO CAPS: 300.0000 mg | ORAL_CAPSULE | Freq: Two times a day (BID) | ORAL | 0 refills | Status: DC

## 2021-05-27 MED ORDER — FLUCONAZOLE 150 MG PO TABS: ORAL_TABLET | ORAL | 0 refills | Status: DC

## 2021-05-27 NOTE — ED Triage Notes (Signed)
Says body aches, headaches, flu symptoms for 3 days.  She is worried about covid.  She has not had a covid test done

## 2021-05-27 NOTE — ED Provider Notes (Signed)
[facility] Emergency Department Provider Note  ____________________________________________   Event Date/Time   First MD Initiated Contact with Patient 05/27/21 1331     (approximate)  I have reviewed the triage vital signs and the nursing notes.   HISTORY  Chief Complaint Generalized Body Aches    HPI Susan Medina is a 75 y.o. female presents emergency department with generalized body aches.  Patient states she had fever for 2 days and cloudy urine but now feels better.  States her family wanted to come here and get a COVID test.  States she is ready to leave and just wants her test.  Past Medical History:  Diagnosis Date   Arthritis    Cancer ([place])    GERD (gastroesophageal reflux disease)    occ-no meds   Hypertension    Hypothyroidism     Patient Active Problem List   Diagnosis Date Noted   Other specified hypothyroidism 12/26/2017   Chronic left-sided low back pain 09/18/2017   Hypertension, essential 09/18/2017   Accelerated hypertension 08/18/2017    Past Surgical History:  Procedure Laterality Date   CYSTOSCOPY W/ RETROGRADES Bilateral 05/13/2020   Procedure: CYSTOSCOPY WITH RETROGRADE PYELOGRAM;  Surgeon: Hollice Espy, MD;  Location: [facility] ORS;  Service: Urology;  Laterality: Bilateral;   ECTOPIC PREGNANCY SURGERY     TRANSURETHRAL RESECTION OF BLADDER TUMOR WITH MITOMYCIN-C N/A 05/13/2020   Procedure: TRANSURETHRAL RESECTION OF BLADDER TUMOR WITH gemcitabine;  Surgeon: Hollice Espy, MD;  Location: [facility] ORS;  Service: Urology;  Laterality: N/A;   TRANSURETHRAL RESECTION OF BLADDER TUMOR WITH MITOMYCIN-C N/A 06/21/2020   Procedure: TRANSURETHRAL RESECTION OF BLADDER TUMOR WITH gemcitabine;  Surgeon: Hollice Espy, MD;  Location: [facility] ORS;  Service: Urology;  Laterality: N/A;    Prior to Admission medications   Medication Sig Start Date End Date Taking? Authorizing Provider  aspirin EC 81 MG EC tablet Take 1 tablet (81 mg  total) by mouth daily. 08/19/17   Fritzi Mandes, MD  diazepam (VALIUM) 10 MG tablet Take 1 tablet (10 mg total) by mouth every 6 (six) hours as needed for anxiety. Take 1 hour prior to CT scan for claustrophobia/anxiety 05/17/21   Hollice Espy, MD  hydrochlorothiazide (HYDRODIURIL) 25 MG tablet Take 1 tablet (25 mg total) by mouth daily. 08/19/17   Fritzi Mandes, MD  levothyroxine (SYNTHROID, LEVOTHROID) 88 MCG tablet Take 88 mcg by mouth daily before breakfast.  09/19/17   [provider]  lisinopril (PRINIVIL,ZESTRIL) 10 MG tablet Take 1 tablet (10 mg total) by mouth daily. 08/19/17   Fritzi Mandes, MD  naproxen sodium (ALEVE) 220 MG tablet Take 220 mg by mouth daily as needed (back pain).    [provider]  VITAMIN D PO Take 1 tablet by mouth 2 (two) times a week.     [provider]    Allergies Patient has no known allergies.  Family History  Problem Relation Age of Onset   Diabetes Mother     Social History Social History   Tobacco Use   Smoking status: Former    Packs/day: 0.50    Years: 50.00    Pack years: 25.00    Types: Cigarettes    Quit date: 04/14/2020    Years since quitting: 1.1   Smokeless tobacco: Never  Vaping Use   Vaping Use: Never used  Substance Use Topics   Alcohol use: No   Drug use: Yes    Types: Marijuana    Comment: occ  Review of Systems  Constitutional: Positive fever/chills Eyes: No visual changes. ENT: No sore throat. Respiratory: Denies cough Cardiovascular: Denies chest pain Gastrointestinal: Denies abdominal pain Genitourinary: Negative for dysuria. Musculoskeletal: Negative for back pain. Skin: Negative for rash. Psychiatric: no mood changes,     ____________________________________________   PHYSICAL EXAM:  VITAL SIGNS: ED Triage Vitals  Enc Vitals Group     BP 05/27/21 1123 120/83     Pulse Rate 05/27/21 1123 94     Resp 05/27/21 1123 14     Temp 05/27/21 1123 99.7 F (37.6 C)     Temp  Source 05/27/21 1123 Oral     SpO2 05/27/21 1123 99 %     Weight 05/27/21 1124 142 lb 15.9 oz (64.9 kg)     Height 05/27/21 1124 '5\' 2"'$  (1.575 m)     Head Circumference --      Peak Flow --      Pain Score 05/27/21 1124 5     Pain Loc --      Pain Edu? --      Excl. in [place]? --     Constitutional: Alert and oriented. Well appearing and in no acute distress. Eyes: Conjunctivae are normal.  Head: Atraumatic. Nose: No congestion/rhinnorhea. Mouth/Throat: Mucous membranes are moist.   Neck:  supple no lymphadenopathy noted Cardiovascular: Normal rate, regular rhythm. Heart sounds are normal Respiratory: Normal respiratory effort.  No retractions, lungs c t a  GU: deferred Musculoskeletal: FROM all extremities, warm and well perfused Neurologic:  Normal speech and language.  Skin:  Skin is warm, dry and intact. No rash noted. Psychiatric: Mood and affect are normal. Speech and behavior are normal.  ____________________________________________   LABS (all labs ordered are listed, but only abnormal results are displayed)  Labs Reviewed  URINALYSIS, COMPLETE (UACMP) WITH MICROSCOPIC - Abnormal; Notable for the following components:      Result Value   Color, Urine AMBER (*)    APPearance CLOUDY (*)    Hgb urine dipstick MODERATE (*)    Ketones, ur 5 (*)    Protein, ur 100 (*)    Leukocytes,Ua LARGE (*)    WBC, UA >50 (*)    Bacteria, UA MANY (*)    Non Squamous Epithelial PRESENT (*)    All other components within normal limits  RESP PANEL BY RT-PCR (FLU A&B, COVID) [id number]   ____________________________________________   ____________________________________________  RADIOLOGY    ____________________________________________   PROCEDURES  Procedure(s) performed: No  Procedures    ____________________________________________   INITIAL IMPRESSION / ASSESSMENT AND PLAN / ED COURSE  Pertinent labs & imaging results that were available during my care of the  patient were reviewed by me and considered in my medical decision making (see chart for details).   The patient is a 75 year old female presents with generalized body aches for 2 days.  Patient states she feels better now.  See HPI physical exam shows patient to be stable and well.  COVID test and urinalysis done.  Patient is wanting to leave.  I explained her I will call her with her test results.  Patient's urine has a large amount of leuks, greater than 50 WBCs, many bacteria, white blood cell clumps are present, yeast is present and hyaline casts are present.  I did call the patient to notify her of the urinary tract infection.  Called in a prescription for Omnicef 300 twice daily for 10 days.  Also for Diflucan 1 now and 1 in a week.  I told her when the COVID test result comes back I will call her about that.   COVID test returned as negative.  I called patient to inform her.  She is going to pick up her antibiotic and Diflucan.  She agrees to the treatment plan.  KEIRAN SANDVIK was evaluated in Emergency Department on 05/27/2021 for the symptoms described in the history of present illness. She was evaluated in the context of the global COVID-19 pandemic, which necessitated consideration that the patient might be at risk for infection with the SARS-CoV-2 virus that causes COVID-19. Institutional protocols and algorithms that pertain to the evaluation of patients at risk for COVID-19 are in a state of rapid change based on information released by regulatory bodies including the CDC and federal and state organizations. These policies and algorithms were followed during the patient's care in the ED.    As part of my medical decision making, I reviewed the following data within the [place] notes reviewed and incorporated, Labs reviewed , Old chart reviewed, Notes from prior ED visits, and [place] Controlled Substance  Database  ____________________________________________   FINAL CLINICAL IMPRESSION(S) / ED DIAGNOSES  Final diagnoses:  Suspected COVID-19 virus infection      NEW MEDICATIONS STARTED DURING THIS VISIT:  New Prescriptions   No medications on file     Note:  This document was prepared using Dragon voice recognition software and may include unintentional dictation errors.    Versie Starks, PA-C 05/27/21 1513    Nena Polio, MD 05/27/21 1630

## 2021-05-30 LAB — URINE CULTURE: Culture: >=100000 — AB

## 2021-06-07 DIAGNOSIS — E039 Hypothyroidism, unspecified: Secondary | ICD-10-CM | POA: Diagnosis not present

## 2021-06-07 DIAGNOSIS — I10 Essential (primary) hypertension: Secondary | ICD-10-CM | POA: Diagnosis not present

## 2021-06-07 DIAGNOSIS — N390 Urinary tract infection, site not specified: Secondary | ICD-10-CM | POA: Diagnosis not present

## 2021-08-10 ENCOUNTER — Emergency Department
Admission: EM | Admit: 2021-08-10 | Discharge: 2021-08-10 | Disposition: A | Discharge status: Home / Self Care | Payer: Medicare HMO | Attending: Emergency Medicine | Admitting: Emergency Medicine

## 2021-08-10 ENCOUNTER — Other Ambulatory Visit: Payer: Self-pay

## 2021-08-10 DIAGNOSIS — Z7982 Long term (current) use of aspirin: Secondary | ICD-10-CM | POA: Insufficient documentation

## 2021-08-10 DIAGNOSIS — I10 Essential (primary) hypertension: Secondary | ICD-10-CM | POA: Diagnosis not present

## 2021-08-10 DIAGNOSIS — E039 Hypothyroidism, unspecified: Secondary | ICD-10-CM | POA: Diagnosis not present

## 2021-08-10 DIAGNOSIS — K623 Rectal prolapse: Secondary | ICD-10-CM | POA: Diagnosis not present

## 2021-08-10 DIAGNOSIS — R195 Other fecal abnormalities: Secondary | ICD-10-CM | POA: Diagnosis present

## 2021-08-10 DIAGNOSIS — Z79899 Other long term (current) drug therapy: Secondary | ICD-10-CM | POA: Diagnosis not present

## 2021-08-10 DIAGNOSIS — Z87891 Personal history of nicotine dependence: Secondary | ICD-10-CM | POA: Insufficient documentation

## 2021-08-10 DIAGNOSIS — Z859 Personal history of malignant neoplasm, unspecified: Secondary | ICD-10-CM | POA: Insufficient documentation

## 2021-08-10 LAB — CBC
HCT: 38.1 % (ref 36.0–46.0)
Hemoglobin: 13.2 g/dL (ref 12.0–15.0)
MCH: 32.3 pg (ref 26.0–34.0)
MCHC: 34.6 g/dL (ref 30.0–36.0)
MCV: 93.2 fL (ref 80.0–100.0)
Platelets: 238 10*3/uL (ref 150–400)
RBC: 4.09 MIL/uL (ref 3.87–5.11)
RDW: 14.6 % (ref 11.5–15.5)
WBC: 11.2 10*3/uL — ABNORMAL HIGH (ref 4.0–10.5)
nRBC: 0 % (ref 0.0–0.2)

## 2021-08-10 LAB — BASIC METABOLIC PANEL
Anion gap: 9 (ref 5–15)
BUN: 31 mg/dL — ABNORMAL HIGH (ref 8–23)
CO2: 26 mmol/L (ref 22–32)
Calcium: 9.8 mg/dL (ref 8.9–10.3)
Chloride: 99 mmol/L (ref 98–111)
Creatinine, Ser: 1.48 mg/dL — ABNORMAL HIGH (ref 0.44–1.00)
GFR, Estimated: 36 mL/min — ABNORMAL LOW (ref >60)
Glucose, Bld: 132 mg/dL — ABNORMAL HIGH (ref 70–99)
Potassium: 3.4 mmol/L — ABNORMAL LOW (ref 3.5–5.1)
Sodium: 134 mmol/L — ABNORMAL LOW (ref 135–145)

## 2021-08-10 NOTE — ED Provider Notes (Signed)
[facility] Emergency Department Provider Note   ____________________________________________   Event Date/Time   First MD Initiated Contact with Patient 08/10/21 2133     (approximate)  I have reviewed the triage vital signs and the nursing notes.   HISTORY  Chief Complaint Blood In Stools    HPI Susan Medina is a 75 y.o. female who was using the bathroom about 5 PM today when she noticed discomfort and a feeling of swelling around the rectum.  Also the area seemed itchy and she noticed a couple drops of blood in the stool afterwards.  Patient reports she had the same happen to her about 2 months ago she actually had to push her rectum back in.  Seems to be the same issue  Also reports a distant history of hemorrhoids but those were many many years ago  No abdominal pain no nausea vomiting.  Reports last week she was fairly constipated had to take Dulcolax which then caused her to have fairly loose stools for the last couple days but has improved  No abdominal pain reports she had discomfort and itching sensation or feeling around her rectum and it felt swollen  Past Medical History:  Diagnosis Date   Arthritis    Cancer ([place])    GERD (gastroesophageal reflux disease)    occ-no meds   Hypertension    Hypothyroidism     Patient Active Problem List   Diagnosis Date Noted   Other specified hypothyroidism 12/26/2017   Chronic left-sided low back pain 09/18/2017   Hypertension, essential 09/18/2017   Accelerated hypertension 08/18/2017    Past Surgical History:  Procedure Laterality Date   CYSTOSCOPY W/ RETROGRADES Bilateral 05/13/2020   Procedure: CYSTOSCOPY WITH RETROGRADE PYELOGRAM;  Surgeon: Hollice Espy, MD;  Location: [facility] ORS;  Service: Urology;  Laterality: Bilateral;   ECTOPIC PREGNANCY SURGERY     TRANSURETHRAL RESECTION OF BLADDER TUMOR WITH MITOMYCIN-C N/A 05/13/2020   Procedure: TRANSURETHRAL RESECTION OF BLADDER TUMOR  WITH gemcitabine;  Surgeon: Hollice Espy, MD;  Location: [facility] ORS;  Service: Urology;  Laterality: N/A;   TRANSURETHRAL RESECTION OF BLADDER TUMOR WITH MITOMYCIN-C N/A 06/21/2020   Procedure: TRANSURETHRAL RESECTION OF BLADDER TUMOR WITH gemcitabine;  Surgeon: Hollice Espy, MD;  Location: [facility] ORS;  Service: Urology;  Laterality: N/A;    Prior to Admission medications   Medication Sig Start Date End Date Taking? Authorizing Provider  aspirin EC 81 MG EC tablet Take 1 tablet (81 mg total) by mouth daily. 08/19/17   Fritzi Mandes, MD  cefdinir (OMNICEF) 300 MG capsule Take 1 capsule (300 mg total) by mouth 2 (two) times daily. 05/27/21   Fisher, Linden Dolin, PA-C  diazepam (VALIUM) 10 MG tablet Take 1 tablet (10 mg total) by mouth every 6 (six) hours as needed for anxiety. Take 1 hour prior to CT scan for claustrophobia/anxiety 05/17/21   Hollice Espy, MD  fluconazole (DIFLUCAN) 150 MG tablet Take one now and one in a week 05/27/21   Caryn Section, Linden Dolin, PA-C  hydrochlorothiazide (HYDRODIURIL) 25 MG tablet Take 1 tablet (25 mg total) by mouth daily. 08/19/17   Fritzi Mandes, MD  levothyroxine (SYNTHROID, LEVOTHROID) 88 MCG tablet Take 88 mcg by mouth daily before breakfast.  09/19/17   [provider]  lisinopril (PRINIVIL,ZESTRIL) 10 MG tablet Take 1 tablet (10 mg total) by mouth daily. 08/19/17   Fritzi Mandes, MD  naproxen sodium (ALEVE) 220 MG tablet Take 220 mg by mouth daily as needed (back pain).  [provider]  VITAMIN D PO Take 1 tablet by mouth 2 (two) times a week.     [provider]    Allergies Patient has no known allergies.  Family History  Problem Relation Age of Onset   Diabetes Mother     Social History Social History   Tobacco Use   Smoking status: Former    Packs/day: 0.50    Years: 50.00    Pack years: 25.00    Types: Cigarettes    Quit date: 04/14/2020    Years since quitting: 1.3   Smokeless tobacco: Never  Vaping Use   Vaping Use:  Never used  Substance Use Topics   Alcohol use: No   Drug use: Yes    Types: Marijuana    Comment: occ    Review of Systems Constitutional: No fever/chills Cardiovascular: Denies chest pain. Respiratory: Denies shortness of breath. Gastrointestinal: No abdominal pain.  See HPI.  Feels like her rectum is popping out slightly Genitourinary: Negative for dysuria. Musculoskeletal: Negative for back pain. Skin: Negative for rash reports the rectum feels a little itchy. Neurological: Negative for weakness.    ____________________________________________   PHYSICAL EXAM:  VITAL SIGNS: ED Triage Vitals  Enc Vitals Group     BP 08/10/21 1820 (!) 148/100     Pulse Rate 08/10/21 1820 95     Resp 08/10/21 1820 17     Temp 08/10/21 1820 98.5 F (36.9 C)     Temp Source 08/10/21 1820 Oral     SpO2 08/10/21 1820 100 %     Weight 08/10/21 1820 139 lb (63 kg)     Height 08/10/21 1820 5\' 3"  (1.6 m)     Head Circumference --      Peak Flow --      Pain Score 08/10/21 1820 0     Pain Loc --      Pain Edu? --      Excl. in [place]? --     Constitutional: Alert and oriented. Well appearing and in no acute distress. Eyes: Conjunctivae are normal. Head: Atraumatic. Nose: No congestion/rhinnorhea. Mouth/Throat: Mucous membranes are moist. Neck: No stridor.  Cardiovascular: Normal rate, regular rhythm.  No good normal distal perfusion Respiratory: Normal respiratory effort.  No retractions.Gastrointestinal: Soft and nontender. No distention.  Examined perineal region with nurse Lilia Pro.  The patient does not exhibit any large external hemorrhoid, but she does demonstrate small approximately wound 2 cm of what appear to be Rougier and mild rectal still prolapse.  There is no active bleeding.  With gentle pressure and lubrication it was diseases to be easily reduced over the course of about 2 minutes.  Thereafter the patient reports all of her symptoms went away and it feels back to normal.  On  digital rectal exam there is no bleeding.  There is palpable likely a small internal hernia as well nontender Musculoskeletal: No lower extremity tenderness nor edema. Neurologic:  Normal speech and language. No gross focal neurologic deficits are appreciated.  Skin:  Skin is warm, dry and intact. No rash noted. Psychiatric: Mood and affect are normal. Speech and behavior are normal.  ____________________________________________   LABS (all labs ordered are listed, but only abnormal results are displayed)  Labs Reviewed  CBC - Abnormal; Notable for the following components:      Result Value   WBC 11.2 (*)    All other components within normal limits  BASIC METABOLIC PANEL - Abnormal; Notable for the following components:  Sodium 134 (*)    Potassium 3.4 (*)    Glucose, Bld 132 (*)    BUN 31 (*)    Creatinine, Ser 1.48 (*)    GFR, Estimated 36 (*)    All other components within normal limits   ____________________________________________  EKG   ____________________________________________  RADIOLOGY   ____________________________________________   PROCEDURES  Procedure(s) performed: None  Procedures  Critical Care performed: No  ____________________________________________   INITIAL IMPRESSION / ASSESSMENT AND PLAN / ED COURSE  Pertinent labs & imaging results that were available during my care of the patient were reviewed by me and considered in my medical decision making (see chart for details).   Reassuring exam.  No active abdominal pain.  Very reassuring abdominal exam.  Clinical exam demonstrates an apparent small rectal prolapse.  This was able to be easily reduced with complete relief of symptoms.  No evidence of incarceration or obstruction.  Patient reports all symptoms resolved and had minimal itching and a feeling of fullness that are now resolved after reducing.  No associate abdominal tenderness on palpation.  No nausea or vomiting.  No black or  bloody stool except as noted this evening with the feeling of fullness and slight amount of blood.  I do not see obvious external hemorrhoid or internal hemorrhoid that is bleeding, but rather findings concerning for small rectal prolapse.  No peritonitis.  No signs of acute intra-abdominal etiology  Patient tolerated well without any concerns or complication.  Reports she had the same happened about 2 months ago was able to push it back in herself.  I want to see what might be going on today, and I think this is consistent with rectal prolapse.  We discussed use of fiber water in her diet, avoidance of constipation etc.  Patient very agreeable with this plan will follow up with general surgery if needed.  Careful return precautions discussed  Return precautions and treatment recommendations and follow-up discussed with the patient who is agreeable with the plan.       ____________________________________________   FINAL CLINICAL IMPRESSION(S) / ED DIAGNOSES  Final diagnoses:  Rectal prolapse        Note:  This document was prepared using Dragon voice recognition software and may include unintentional dictation errors       Delman Kitten, MD 08/10/21 2212

## 2021-08-10 NOTE — Discharge Instructions (Addendum)
? ?  Please return to the emergency room right away if you are to develop a fever, severe nausea, your pain becomes severe or worsens, you are unable to keep food down, begin vomiting any dark or bloody fluid, you develop any dark or bloody stools, feel dehydrated, or other new concerns or symptoms arise. ? ?

## 2021-08-10 NOTE — ED Provider Notes (Signed)
Emergency Medicine Provider Triage Evaluation Note  CHEETARA HOGE, a 75 y.o. female  was evaluated in triage.  Pt complains of blood in toilet. She noticed it today, after 2 days of persistent watery diarrhea, after taking 2 Ducolax tabs 3 days prior for constipation. She noted pain at the rectum and blood in the toilet after stooling today.   Review of Systems  Positive: Blood in stool Negative: ABD pain  Physical Exam  BP (!) 148/100   Pulse 95   Temp 98.5 F (36.9 C) (Oral)   Resp 17   Ht 5\' 3"  (1.6 m)   Wt 63 kg   SpO2 100%   BMI 24.62 kg/m  Gen:   Awake, no distress  NAD Resp:  Normal effort CTA MSK:   Moves extremities without difficulty   Other:  ABD: soft, nontender  Medical Decision Making  Medically screening exam initiated at 6:34 PM.  Appropriate orders placed.  HERLINDA HEADY was informed that the remainder of the evaluation will be completed by another provider, this initial triage assessment does not replace that evaluation, and the importance of remaining in the ED until their evaluation is complete.  Patient with ED evaluation of blood in the toilet following normal stool today, after several days of laxative-induced diarrhea.    Melvenia Needles, PA-C 08/10/21 Kristian Covey    Delman Kitten, MD 08/10/21 2333

## 2021-08-10 NOTE — ED Triage Notes (Addendum)
Pt comes pov with blood in her stool. Was constipated and took stool softener. Went to the bathroom after stool softener and saw blood in toilet. Hx of hemorrhoids and states burning around her rectum.

## 2021-08-16 NOTE — Progress Notes (Incomplete)
   08/16/21  CC: No chief complaint on file.    HPI: Susan Medina is a 75 y.o.female with a personal history of  bladder cancer, who presents today for surveillance cystoscopy.   She is s/p TURBT, B RTG, intravesical chemo on 05/13/2020. . Pathology showed T1 urothelial carcinoma, high grade, with extensive invasion of lamina propria. Urothelial carcinoma in situ involving 4 of 4 fragments. There was focal superficial lamina propria invasion (1 mm) in 2 of 4 fragments. One fragment of muscularis propria was present and uninvolved. No muscularis propria present.   She underwent repeat TURBT and intravesical chemotherapy on 06/21/2020. Pathology revealed urothelial carcinoma in situ, with marked underlying mixed inflammation. Negative for invasive carcinoma. Urothelial carcinoma in situ, focally suspicious for lamina propria. Invasion with extensive necrosis and mixed inflammation.   Completed BCG x 6   08/2021.  She was seen in the ED on 08/10/2021 or rectal prolapse.       There were no vitals filed for this visit. NED. A&Ox3.   No respiratory distress   Abd soft, NT, ND Normal external genitalia with patent urethral meatus  Cystoscopy Procedure Note  Patient identification was confirmed, informed consent was obtained, and patient was prepped using Betadine solution.  Lidocaine jelly was administered per urethral meatus.    Procedure: - Flexible cystoscope introduced, without any difficulty.   - Thorough search of the bladder revealed:    normal urethral meatus    normal urothelium    no stones    no ulcers     no tumors    no urethral polyps    no trabeculation  - Ureteral orifices were normal in position and appearance.  Post-Procedure: - Patient tolerated the procedure well  Assessment/ Plan:    No follow-ups on file.  I,Kailey Littlejohn,acting as a Education administrator for Hollice Espy, MD.,have documented all relevant documentation on the behalf of Hollice Espy,  MD,as directed by  Hollice Espy, MD while in the presence of Hollice Espy, MD.

## 2021-08-17 ENCOUNTER — Other Ambulatory Visit: Payer: Self-pay | Admitting: Urology

## 2021-08-17 ENCOUNTER — Encounter: Payer: Self-pay | Admitting: Urology

## 2021-08-19 ENCOUNTER — Telehealth: Payer: Self-pay | Admitting: Urology

## 2021-08-19 NOTE — Telephone Encounter (Signed)
Susan Medina was a no-show for her cystoscopy today.  It is important that she follow-up with this.  Can you reach out to her and advised her to reschedule?

## 2021-08-19 NOTE — Telephone Encounter (Signed)
Left patient a VM asked to return call

## 2021-08-30 NOTE — Telephone Encounter (Signed)
Patient advised, cysto appointment rescheduled.

## 2021-09-13 ENCOUNTER — Other Ambulatory Visit: Payer: Medicare HMO | Admitting: Urology

## 2021-09-20 NOTE — Progress Notes (Signed)
   09/21/21  CC:  Chief Complaint  Patient presents with   Cysto    HPI: Susan Medina is a 75 y.o.female with a personal history of bladder cancer, who returns today for 3 month surveillance cystoscopy.   She is s/p TURBT intravesical chemo on 05/13/2020. Pathology showed T1 urothelial carcinoma, high grade, with extensive invasion of lamina propria. Urothelial carcinoma in situ involving 4 of 4 fragments. There was focal superficial lamina propria invasion (1 mm) in 2 of 4 fragments. One fragment of muscularis propria was present and uninvolved. No muscularis propria present.   Patient underwent TURBT and intravesical chemotherapy on 06/21/2020. Pathology revealed urothelial carcinoma in situ, with marked underlying mixed inflammation. Negative for invasive carcinoma. No muscularis propria identified. Urothelial carcinoma in situ, focally suspicious for lamina propria. Invasion with extensive necrosis and mixed inflammation. Muscularis propria present and uninvolved.     She completed BCG x6 in 08/2020  She was recently seen in the ED for rectal prolapse on 08/10/2021.    Vitals:   09/21/21 1057  BP: 117/80  Pulse: 93  NED. A&Ox3.   No respiratory distress   Abd soft, NT, ND Normal external genitalia with patent urethral meatus  Cystoscopy Procedure Note  Patient identification was confirmed, informed consent was obtained, and patient was prepped using Betadine solution.  Lidocaine jelly was administered per urethral meatus.    Procedure: - Flexible cystoscope introduced, without any difficulty.   - Thorough search of the bladder revealed:    Multi focal areas of irritation erythema versus CIS    normal urethral meatus    normal urothelium; multifocal stellate scars including adjacent to the bladder neck    no stones    no ulcers     no tumors    no urethral polyps    no trabeculation  - Ureteral orifices were normal in position and  appearance.  Post-Procedure: - Patient tolerated the procedure well  Assessment/ Plan:  History of bladder cancer  -History of high-grade T1 TCC with CIS status post induction BCG  - Urine sent for cytology  - Cystoscopy concerning for erythematous irritation versus CIS - Will follow-up with results and will urge for biopsy if atypical  Will call with urine cytology results. Otherwise cysto in 3 months  I,Kailey Littlejohn,acting as a scribe for Hollice Espy, MD.,have documented all relevant documentation on the behalf of Hollice Espy, MD,as directed by  Hollice Espy, MD while in the presence of Hollice Espy, MD.  I have reviewed the above documentation for accuracy and completeness, and I agree with the above.   Hollice Espy, MD

## 2021-09-21 ENCOUNTER — Encounter: Payer: Self-pay | Admitting: Urology

## 2021-09-21 ENCOUNTER — Other Ambulatory Visit: Payer: Self-pay

## 2021-09-21 ENCOUNTER — Ambulatory Visit: Payer: Medicare HMO | Admitting: Urology

## 2021-09-21 VITALS — BP 117/80 | HR 93 | Ht 63.0 in | Wt 139.0 lb

## 2021-09-21 DIAGNOSIS — Z8551 Personal history of malignant neoplasm of bladder: Secondary | ICD-10-CM

## 2021-09-21 LAB — URINALYSIS, COMPLETE
Bilirubin, UA: NEGATIVE
Glucose, UA: NEGATIVE
Ketones, UA: NEGATIVE
Leukocytes,UA: NEGATIVE
Nitrite, UA: NEGATIVE
Protein,UA: NEGATIVE
Specific Gravity, UA: 1.015 (ref 1.005–1.030)
Urobilinogen, Ur: 0.2 mg/dL (ref 0.2–1.0)
pH, UA: 5.5 (ref 5.0–7.5)

## 2021-09-21 LAB — MICROSCOPIC EXAMINATION: Bacteria, UA: NONE SEEN

## 2021-09-23 LAB — CYTOLOGY - NON PAP

## 2021-10-06 ENCOUNTER — Telehealth: Payer: Self-pay | Admitting: *Deleted

## 2021-10-06 NOTE — Telephone Encounter (Addendum)
Spoke with sister Graciella Freer (on [place]) will go to her house to have her return call.   ----- Message from Hollice Espy, MD sent at 10/05/2021  2:20 PM EST ----- I have left two VM (last week and this week) for Ms. Mccorry about her abnormal urine cytology.  I think she needs a bladder bx. I have asked her to call me back on both occasions.  Can you please continue to reach out to her?  We may need to send a letter.    Hollice Espy, MD

## 2021-10-07 NOTE — Telephone Encounter (Addendum)
Patient returned call confirmed agree to proceed with bladder biopsy as discussed. Best to reach her sister, her home number has been unavailable.

## 2021-10-10 NOTE — Telephone Encounter (Signed)
Surgical Physician Order Form [facility] [place]  * Scheduling expectation : Next Available  *Length of Case:   *Clearance needed: no  *Anticoagulation Instructions: Hold all anticoagulants  *Aspirin Instructions: Ok to continue Aspirin  *Post-op visit Date/Instructions:  Keep current f/u cysto; will call with patholgoy  *Diagnosis:  bladder erythema, history of bladder cancer  *Procedure: bilateral  retrograde pyelogram, bladder biopsy   Additional orders: N/A  -Admit type: OUTpatient  -Anesthesia: General  -VTE Prophylaxis Standing Order SCD's       Other:   -Standing Lab Orders Per Anesthesia    Lab other: UA&Urine Culture  -Standing Test orders EKG/Chest x-ray per Anesthesia       Test other:   - Medications:  Ancef 2gm IV  -Other orders:  N/A

## 2021-10-11 ENCOUNTER — Ambulatory Visit: Payer: Medicare HMO | Admitting: Urology

## 2021-10-14 ENCOUNTER — Other Ambulatory Visit: Payer: Self-pay

## 2021-10-14 DIAGNOSIS — N3289 Other specified disorders of bladder: Secondary | ICD-10-CM

## 2021-10-14 DIAGNOSIS — Z8551 Personal history of malignant neoplasm of bladder: Secondary | ICD-10-CM

## 2021-10-14 NOTE — Progress Notes (Signed)
[facility] Posting Form   Surgery Date/Time: Date: 11/14/2021  Surgeon: Dr. Hollice Espy, MD   Surgery Location: Day Surgery  Inpt ( No  )   Outpt (Yes)   Obs ( No  )   Diagnosis: Bladder Erythema N32.89, History of Bladder Cancer Z85.51  -CPT: 26948, [id number]  Surgery: Cystoscopy with bladder biopsy and bilateral retrograde pyelograms  Stop Anticoagulations: Yes, hold ASA too  Cardiac/Medical/Pulmonary Clearance needed: no  *Orders entered into EPIC  Date: 10/14/21   *Case booked in [place]  Date: 10/13/2021  *Notified pt of Surgery: Date: 10/13/2021  PRE-OP UA & CX: yes, will obtain on 11/01/2021  *Placed into Prior Authorization Work Fabio Bering Date: 10/14/21   Assistant/laser/rep:No

## 2021-11-01 ENCOUNTER — Other Ambulatory Visit: Payer: Medicare HMO

## 2021-11-01 ENCOUNTER — Other Ambulatory Visit: Payer: Self-pay

## 2021-11-01 DIAGNOSIS — Z8551 Personal history of malignant neoplasm of bladder: Secondary | ICD-10-CM | POA: Diagnosis not present

## 2021-11-01 DIAGNOSIS — N3289 Other specified disorders of bladder: Secondary | ICD-10-CM | POA: Diagnosis not present

## 2021-11-01 DIAGNOSIS — R350 Frequency of micturition: Secondary | ICD-10-CM | POA: Diagnosis not present

## 2021-11-01 LAB — URINALYSIS, COMPLETE
Bilirubin, UA: NEGATIVE
Glucose, UA: NEGATIVE
Ketones, UA: NEGATIVE
Leukocytes,UA: NEGATIVE
Nitrite, UA: NEGATIVE
Protein,UA: NEGATIVE
RBC, UA: NEGATIVE
Specific Gravity, UA: <1.005 — ABNORMAL LOW (ref 1.005–1.030)
Urobilinogen, Ur: 0.2 mg/dL (ref 0.2–1.0)
pH, UA: 5.5 (ref 5.0–7.5)

## 2021-11-01 LAB — MICROSCOPIC EXAMINATION
Bacteria, UA: NONE SEEN
Epithelial Cells (non renal): NONE SEEN /hpf (ref 0–10)
RBC, Urine: NONE SEEN /hpf (ref 0–2)

## 2021-11-03 LAB — CULTURE, URINE COMPREHENSIVE

## 2021-11-04 ENCOUNTER — Other Ambulatory Visit
Admission: RE | Admit: 2021-11-04 | Discharge: 2021-11-04 | Disposition: A | Discharge status: Home / Self Care | Payer: Medicare HMO | Source: Ambulatory Visit | Attending: Urology | Admitting: Urology

## 2021-11-04 ENCOUNTER — Other Ambulatory Visit: Payer: Self-pay

## 2021-11-04 VITALS — Ht 62.0 in | Wt 139.0 lb

## 2021-11-04 DIAGNOSIS — I10 Essential (primary) hypertension: Secondary | ICD-10-CM

## 2021-11-04 NOTE — Patient Instructions (Addendum)
Your procedure is scheduled on: Monday November 14, 2021. Report to Day Surgery inside [facility] 2nd floor. To find out your arrival time please call [phone] between 1PM - 3PM on Friday November 11, 2021.  Remember: Instructions that are not followed completely may result in serious medical risk,  up to and including death, or upon the discretion of your surgeon and anesthesiologist your  surgery may need to be rescheduled.     _X__ 1. Do not eat food or drink fluids after midnight the night before your procedure.                 No chewing gum or hard candies.   __X__2.  On the morning of surgery brush your teeth with toothpaste and water, you                may rinse your mouth with mouthwash if you wish.  Do not swallow any toothpaste or mouthwash.     _X__ 3.  No Alcohol for 24 hours before or after surgery.   _X__ 4.  Do Not Smoke or use e-cigarettes For 24 Hours Prior to Your Surgery.                 Do not use any chewable tobacco products for at least 6 hours prior to                 Surgery.  _X__  5.  Do not use any recreational drugs (marijuana, cocaine, heroin, ecstasy, MDMA or other)                For at least one week prior to your surgery.  Combination of these drugs with anesthesia                May have life threatening results.  __X__6.  Notify your doctor if there is any change in your medical condition      (cold, fever, infections).     Do not wear jewelry, make-up, hairpins, clips or nail polish. Do not wear lotions, powders, or perfumes. You may wear deodorant. Do not shave 48 hours prior to surgery. Men may shave face and neck. Do not bring valuables to the hospital.    [facility] is not responsible for any belongings or valuables.  Contacts, dentures or bridgework may not be worn into surgery. Leave your suitcase in the car. After surgery it may be brought to your room. For patients admitted to the hospital, discharge time  is determined by your treatment team.   Patients discharged the day of surgery will not be allowed to drive home.   Make arrangements for someone to be with you for the first 24 hours of your Same Day Discharge.   __X__ Take these medicines the morning of surgery with A SIP OF WATER:    1. levothyroxine (SYNTHROID, LEVOTHROID) 88 MCG   2.   3.   4.  5.  6.  ____ Fleet Enema (as directed)   ____ Use CHG Soap (or wipes) as directed  ____ Use Benzoyl Peroxide Gel as instructed  ____ Use inhalers on the day of surgery  ____ Stop metformin 2 days prior to surgery    ____ Take 1/2 of usual insulin dose the night before surgery. No insulin the morning          of surgery.   __X__ Stop your aspirin 81 mg 5 days before your surgery. (Last day for 11/08/21)  __X__  One Week prior to surgery- Stop Anti-inflammatories such as Ibuprofen, Aleve, Advil, Motrin, meloxicam (MOBIC), diclofenac, etodolac, ketorolac, Toradol, Daypro, piroxicam, Goody's or BC powders. OK TO USE TYLENOL IF NEEDED   __X__ Do not start any vitamins and or herbal supplements until after surgery.    ____ Bring C-Pap to the hospital.    If you have any questions regarding your pre-procedure instructions,  Please call Pre-admit Testing at [phone]

## 2021-11-07 ENCOUNTER — Other Ambulatory Visit: Admission: RE | Admit: 2021-11-07 | Payer: Medicare HMO | Source: Ambulatory Visit

## 2021-11-08 ENCOUNTER — Other Ambulatory Visit: Payer: Self-pay

## 2021-11-08 ENCOUNTER — Encounter
Admission: RE | Admit: 2021-11-08 | Discharge: 2021-11-08 | Disposition: A | Discharge status: Home / Self Care | Payer: Medicare HMO | Source: Ambulatory Visit | Attending: Urology | Admitting: Urology

## 2021-11-08 DIAGNOSIS — I10 Essential (primary) hypertension: Secondary | ICD-10-CM | POA: Insufficient documentation

## 2021-11-08 DIAGNOSIS — Z01818 Encounter for other preprocedural examination: Secondary | ICD-10-CM | POA: Diagnosis not present

## 2021-11-08 LAB — CBC
HCT: 38.4 % (ref 36.0–46.0)
Hemoglobin: 12.8 g/dL (ref 12.0–15.0)
MCH: 31 pg (ref 26.0–34.0)
MCHC: 33.3 g/dL (ref 30.0–36.0)
MCV: 93 fL (ref 80.0–100.0)
Platelets: 257 10*3/uL (ref 150–400)
RBC: 4.13 MIL/uL (ref 3.87–5.11)
RDW: 13.9 % (ref 11.5–15.5)
WBC: 7.8 10*3/uL (ref 4.0–10.5)
nRBC: 0 % (ref 0.0–0.2)

## 2021-11-08 LAB — BASIC METABOLIC PANEL
Anion gap: 6 (ref 5–15)
BUN: 21 mg/dL (ref 8–23)
CO2: 26 mmol/L (ref 22–32)
Calcium: 9.7 mg/dL (ref 8.9–10.3)
Chloride: 107 mmol/L (ref 98–111)
Creatinine, Ser: 1.17 mg/dL — ABNORMAL HIGH (ref 0.44–1.00)
GFR, Estimated: 48 mL/min — ABNORMAL LOW (ref >60)
Glucose, Bld: 112 mg/dL — ABNORMAL HIGH (ref 70–99)
Potassium: 4 mmol/L (ref 3.5–5.1)
Sodium: 139 mmol/L (ref 135–145)

## 2021-11-14 ENCOUNTER — Ambulatory Visit: Payer: Medicare HMO | Admitting: Anesthesiology

## 2021-11-14 ENCOUNTER — Telehealth: Payer: Self-pay | Admitting: Urology

## 2021-11-14 ENCOUNTER — Other Ambulatory Visit: Payer: Self-pay | Admitting: Urology

## 2021-11-14 ENCOUNTER — Encounter
Admission: RE | Disposition: A | Discharge status: Home / Self Care | Payer: Self-pay | Source: Home / Self Care | Attending: Urology

## 2021-11-14 ENCOUNTER — Ambulatory Visit
Admission: RE | Admit: 2021-11-14 | Discharge: 2021-11-14 | Disposition: A | Discharge status: Home / Self Care | Payer: Medicare HMO | Attending: Urology | Admitting: Urology

## 2021-11-14 ENCOUNTER — Ambulatory Visit: Payer: Medicare HMO

## 2021-11-14 ENCOUNTER — Other Ambulatory Visit: Payer: Self-pay

## 2021-11-14 ENCOUNTER — Encounter: Payer: Self-pay | Admitting: Urology

## 2021-11-14 DIAGNOSIS — N3289 Other specified disorders of bladder: Secondary | ICD-10-CM

## 2021-11-14 DIAGNOSIS — R8289 Other abnormal findings on cytological and histological examination of urine: Secondary | ICD-10-CM

## 2021-11-14 DIAGNOSIS — Z8551 Personal history of malignant neoplasm of bladder: Secondary | ICD-10-CM

## 2021-11-14 DIAGNOSIS — E039 Hypothyroidism, unspecified: Secondary | ICD-10-CM | POA: Diagnosis not present

## 2021-11-14 DIAGNOSIS — R8290 Unspecified abnormal findings in urine: Secondary | ICD-10-CM | POA: Diagnosis not present

## 2021-11-14 DIAGNOSIS — N3020 Other chronic cystitis without hematuria: Secondary | ICD-10-CM | POA: Diagnosis not present

## 2021-11-14 HISTORY — PX: CYSTOSCOPY WITH BIOPSY: SHX5122

## 2021-11-14 HISTORY — PX: CYSTOSCOPY W/ RETROGRADES: SHX1426

## 2021-11-14 SURGERY — CYSTOSCOPY, WITH RETROGRADE PYELOGRAM
Anesthesia: General

## 2021-11-14 MED ORDER — FAMOTIDINE 20 MG PO TABS
ORAL_TABLET | ORAL | Status: AC
  Administered 2021-11-14: 20 mg via ORAL
  Filled 2021-11-14: qty 1

## 2021-11-14 MED ORDER — PHENYLEPHRINE HCL (PRESSORS) 10 MG/ML IV SOLN
INTRAVENOUS | Status: DC | PRN
  Administered 2021-11-14 (×2): 100 ug via INTRAVENOUS
  Administered 2021-11-14: 200 ug via INTRAVENOUS
  Administered 2021-11-14 (×2): 150 ug via INTRAVENOUS

## 2021-11-14 MED ORDER — FENTANYL CITRATE (PF) 100 MCG/2ML IJ SOLN: 25.0000 ug | INTRAMUSCULAR | Status: DC | PRN

## 2021-11-14 MED ORDER — LACTATED RINGERS IV SOLN: INTRAVENOUS | Status: DC

## 2021-11-14 MED ORDER — PROPOFOL 10 MG/ML IV BOLUS
INTRAVENOUS | Status: DC | PRN
  Administered 2021-11-14: 50 mg via INTRAVENOUS
  Administered 2021-11-14: 150 mg via INTRAVENOUS

## 2021-11-14 MED ORDER — DEXAMETHASONE SODIUM PHOSPHATE 10 MG/ML IJ SOLN
INTRAMUSCULAR | Status: DC | PRN
  Administered 2021-11-14: 10 mg via INTRAVENOUS

## 2021-11-14 MED ORDER — CEFAZOLIN SODIUM-DEXTROSE 2-4 GM/100ML-% IV SOLN
2.0000 g | INTRAVENOUS | Status: AC
  Administered 2021-11-14: 2 g via INTRAVENOUS

## 2021-11-14 MED ORDER — ORAL CARE MOUTH RINSE: 15.0000 mL | Freq: Once | OROMUCOSAL | Status: AC

## 2021-11-14 MED ORDER — PROPOFOL 10 MG/ML IV BOLUS
INTRAVENOUS | Status: AC
  Filled 2021-11-14: qty 40

## 2021-11-14 MED ORDER — ONDANSETRON HCL 4 MG/2ML IJ SOLN
INTRAMUSCULAR | Status: DC | PRN
  Administered 2021-11-14: 4 mg via INTRAVENOUS

## 2021-11-14 MED ORDER — FAMOTIDINE 20 MG PO TABS
20.0000 mg | ORAL_TABLET | Freq: Once | ORAL | Status: AC
Start: 2021-11-14 — End: 2021-11-14

## 2021-11-14 MED ORDER — LIDOCAINE HCL (CARDIAC) PF 100 MG/5ML IV SOSY
PREFILLED_SYRINGE | INTRAVENOUS | Status: DC | PRN
  Administered 2021-11-14: 80 mg via INTRAVENOUS

## 2021-11-14 MED ORDER — CHLORHEXIDINE GLUCONATE 0.12 % MT SOLN: 15.0000 mL | Freq: Once | OROMUCOSAL | Status: AC

## 2021-11-14 MED ORDER — CEFAZOLIN SODIUM-DEXTROSE 2-4 GM/100ML-% IV SOLN
INTRAVENOUS | Status: AC
  Filled 2021-11-14: qty 100

## 2021-11-14 MED ORDER — ONDANSETRON HCL 4 MG/2ML IJ SOLN: 4.0000 mg | Freq: Once | INTRAMUSCULAR | Status: DC | PRN

## 2021-11-14 MED ORDER — KETOROLAC TROMETHAMINE 30 MG/ML IJ SOLN
INTRAMUSCULAR | Status: DC | PRN
  Administered 2021-11-14: 15 mg via INTRAVENOUS

## 2021-11-14 MED ORDER — KETOROLAC TROMETHAMINE 30 MG/ML IJ SOLN
INTRAMUSCULAR | Status: AC
  Filled 2021-11-14: qty 1

## 2021-11-14 MED ORDER — DEXAMETHASONE SODIUM PHOSPHATE 10 MG/ML IJ SOLN
INTRAMUSCULAR | Status: AC
  Filled 2021-11-14: qty 1

## 2021-11-14 MED ORDER — SUCCINYLCHOLINE CHLORIDE 200 MG/10ML IV SOSY
PREFILLED_SYRINGE | INTRAVENOUS | Status: DC | PRN
  Administered 2021-11-14: 120 mg via INTRAVENOUS

## 2021-11-14 MED ORDER — CHLORHEXIDINE GLUCONATE 0.12 % MT SOLN
OROMUCOSAL | Status: AC
  Administered 2021-11-14: 15 mL via OROMUCOSAL
  Filled 2021-11-14: qty 15

## 2021-11-14 MED ORDER — ONDANSETRON HCL 4 MG/2ML IJ SOLN
INTRAMUSCULAR | Status: AC
  Filled 2021-11-14: qty 2

## 2021-11-14 MED ORDER — FENTANYL CITRATE (PF) 100 MCG/2ML IJ SOLN
INTRAMUSCULAR | Status: AC
  Filled 2021-11-14: qty 2

## 2021-11-14 MED ORDER — FENTANYL CITRATE (PF) 100 MCG/2ML IJ SOLN
INTRAMUSCULAR | Status: DC | PRN
  Administered 2021-11-14 (×2): 50 ug via INTRAVENOUS

## 2021-11-14 MED ORDER — LIDOCAINE HCL (PF) 2 % IJ SOLN
INTRAMUSCULAR | Status: AC
  Filled 2021-11-14: qty 5

## 2021-11-14 SURGICAL SUPPLY — 24 items
BAG DRAIN CYSTO-URO LG1000N (MISCELLANEOUS) ×3 IMPLANT
BRUSH SCRUB EZ  4% CHG (MISCELLANEOUS) ×1
BRUSH SCRUB EZ 1% IODOPHOR (MISCELLANEOUS) ×3 IMPLANT
BRUSH SCRUB EZ 4% CHG (MISCELLANEOUS) ×2 IMPLANT
CATH URETL OPEN 5X70 (CATHETERS) ×3 IMPLANT
DRAPE UTILITY 15X26 TOWEL STRL (DRAPES) ×3 IMPLANT
DRSG TELFA 4X3 1S NADH ST (GAUZE/BANDAGES/DRESSINGS) ×3 IMPLANT
ELECT REM PT RETURN 9FT ADLT (ELECTROSURGICAL) ×3
ELECTRODE REM PT RTRN 9FT ADLT (ELECTROSURGICAL) ×2 IMPLANT
GAUZE 4X4 16PLY ~~LOC~~+RFID DBL (SPONGE) ×6 IMPLANT
GLOVE SURG ENC MOIS LTX SZ6.5 (GLOVE) ×3 IMPLANT
GOWN STRL REUS W/ TWL LRG LVL3 (GOWN DISPOSABLE) ×4 IMPLANT
GOWN STRL REUS W/TWL LRG LVL3 (GOWN DISPOSABLE) ×2
GUIDEWIRE STR DUAL SENSOR (WIRE) ×3 IMPLANT
IV NS IRRIG 3000ML ARTHROMATIC (IV SOLUTION) ×3 IMPLANT
KIT TURNOVER CYSTO (KITS) ×3 IMPLANT
NDL SAFETY ECLIPSE 18X1.5 (NEEDLE) ×2 IMPLANT
NEEDLE HYPO 18GX1.5 SHARP (NEEDLE) ×1
PACK CYSTO AR (MISCELLANEOUS) ×3 IMPLANT
SET CYSTO W/LG BORE CLAMP LF (SET/KITS/TRAYS/PACK) ×3 IMPLANT
SURGILUBE 2OZ TUBE FLIPTOP (MISCELLANEOUS) ×3 IMPLANT
WATER STERILE IRR 1000ML POUR (IV SOLUTION) ×3 IMPLANT
WATER STERILE IRR 3000ML UROMA (IV SOLUTION) ×3 IMPLANT
WATER STERILE IRR 500ML POUR (IV SOLUTION) ×3 IMPLANT

## 2021-11-14 NOTE — H&P (Signed)
09/21/21   CC:     Chief Complaint  Patient presents with   Cysto      HPI: Susan Medina is a 76 y.o.female with a personal history of bladder cancer, who returns today for 3 month surveillance cystoscopy.    She is s/p TURBT intravesical chemo on 05/13/2020. Pathology showed T1 urothelial carcinoma, high grade, with extensive invasion of lamina propria. Urothelial carcinoma in situ involving 4 of 4 fragments. There was focal superficial lamina propria invasion (1 mm) in 2 of 4 fragments. One fragment of muscularis propria was present and uninvolved. No muscularis propria present.   Patient underwent TURBT and intravesical chemotherapy on 06/21/2020. Pathology revealed urothelial carcinoma in situ, with marked underlying mixed inflammation. Negative for invasive carcinoma. No muscularis propria identified. Urothelial carcinoma in situ, focally suspicious for lamina propria. Invasion with extensive necrosis and mixed inflammation. Muscularis propria present and uninvolved.     She completed BCG x6 in 08/2020   She was recently seen in the ED for rectal prolapse on 08/10/2021.         Vitals:    09/21/21 1057  BP: 117/80  Pulse: 93  NED. A&Ox3.   No respiratory distress   Abd soft, NT, ND Normal external genitalia with patent urethral meatus   Cystoscopy Procedure Note   Patient identification was confirmed, informed consent was obtained, and patient was prepped using Betadine solution.  Lidocaine jelly was administered per urethral meatus.     Procedure: - Flexible cystoscope introduced, without any difficulty.   - Thorough search of the bladder revealed:    Multi focal areas of irritation erythema versus CIS    normal urethral meatus    normal urothelium; multifocal stellate scars including adjacent to the bladder neck    no stones    no ulcers     no tumors    no urethral polyps    no trabeculation   - Ureteral orifices were normal in position and appearance.    Post-Procedure: - Patient tolerated the procedure well   Assessment/ Plan:   History of bladder cancer  -History of high-grade T1 TCC with CIS status post induction BCG  - Urine sent for cytology  - Cystoscopy concerning for erythematous irritation versus CIS - Will follow-up with results and will urge for biopsy if atypical   Cytology suspicious, recommemd bladder bx and bilateral retrograde Risks including bleeding, infection, damage to bladder ect. All questions answered  RRR CTAB    Hollice Espy, MD

## 2021-11-14 NOTE — Discharge Instructions (Addendum)

## 2021-11-14 NOTE — Anesthesia Postprocedure Evaluation (Signed)
Anesthesia Post Note  Patient: Susan Medina  Procedure(s) Performed: CYSTOSCOPY (Bilateral) CYSTOSCOPY WITH BLADDER BIOPSY  Patient location during evaluation: PACU Anesthesia Type: General Level of consciousness: awake and awake and alert Pain management: satisfactory to patient Vital Signs Assessment: post-procedure vital signs reviewed and stable Respiratory status: spontaneous breathing and respiratory function stable Anesthetic complications: no   No notable events documented.   Last Vitals:  Vitals:   11/14/21 1315 11/14/21 1330  BP: (!) 141/83 (!) 152/79  Pulse: 60 60  Resp: 15 16  Temp:  36.6 C  SpO2: 94% 97%    Last Pain:  Vitals:   11/14/21 1330  TempSrc:   PainSc: 0-No pain                 VAN STAVEREN,Murdock Jellison

## 2021-11-14 NOTE — Anesthesia Procedure Notes (Signed)
Procedure Name: Intubation Date/Time: 11/14/2021 12:00 PM Performed by: Esaw Grandchild, CRNA Pre-anesthesia Checklist: Patient identified, Emergency Drugs available, Suction available and Patient being monitored Patient Re-evaluated:Patient Re-evaluated prior to induction Oxygen Delivery Method: Circle system utilized Preoxygenation: Pre-oxygenation with 100% oxygen Induction Type: IV induction Ventilation: Mask ventilation without difficulty Laryngoscope Size: Miller and 2 Grade View: Grade I Tube type: Oral Tube size: 7.0 mm Number of attempts: 1 Airway Equipment and Method: Stylet, Oral airway and Bite block Placement Confirmation: ETT inserted through vocal cords under direct vision, positive ETCO2 and breath sounds checked- equal and bilateral Secured at: 22 cm Tube secured with: Tape Dental Injury: Teeth and Oropharynx as per pre-operative assessment  Comments: 1st attempt with LMA unable to get good seal, decision made w/Dr. Laureen Abrahams to intubate, Pt stable throughout no issues

## 2021-11-14 NOTE — Anesthesia Preprocedure Evaluation (Signed)
Anesthesia Evaluation  Patient identified by MRN, date of birth, ID band Patient awake    Reviewed: Allergy & Precautions, NPO status , Patient's Chart, lab work & pertinent test results  Airway Mallampati: II  TM Distance: >3 FB Neck ROM: full    Dental  (+) Edentulous Upper, Edentulous Lower   Pulmonary neg pulmonary ROS, COPD, former smoker,    Pulmonary exam normal  + decreased breath sounds      Cardiovascular Exercise Tolerance: Good hypertension, Pt. on medications negative cardio ROS Normal cardiovascular exam Rhythm:Regular     Neuro/Psych Seizures -, Well Controlled,  negative neurological ROS  negative psych ROS   GI/Hepatic negative GI ROS, Neg liver ROS, GERD  Medicated,  Endo/Other  negative endocrine ROSHypothyroidism   Renal/GU   negative genitourinary   Musculoskeletal  (+) Arthritis ,   Abdominal Normal abdominal exam  (+)   Peds negative pediatric ROS (+)  Hematology negative hematology ROS (+) REFUSES BLOOD PRODUCTS, JEHOVAH'S WITNESS  Anesthesia Other Findings Past Medical History: No date: Arthritis No date: Cancer (HCC) No date: GERD (gastroesophageal reflux disease)     Comment:  occ-no meds No date: Hypertension No date: Hypothyroidism  Past Surgical History: 05/13/2020: CYSTOSCOPY W/ RETROGRADES; Bilateral     Comment:  Procedure: CYSTOSCOPY WITH RETROGRADE PYELOGRAM;                Surgeon: Hollice Espy, MD;  Location: [facility] ORS;                Service: Urology;  Laterality: Bilateral; No date: ECTOPIC PREGNANCY SURGERY 05/13/2020: TRANSURETHRAL RESECTION OF BLADDER TUMOR WITH MITOMYCIN-C;  N/A     Comment:  Procedure: TRANSURETHRAL RESECTION OF BLADDER TUMOR WITH              gemcitabine;  Surgeon: Hollice Espy, MD;  Location:               [facility] ORS;  Service: Urology;  Laterality: N/A; 06/21/2020: TRANSURETHRAL RESECTION OF BLADDER TUMOR WITH MITOMYCIN-C;  N/A     Comment:   Procedure: TRANSURETHRAL RESECTION OF BLADDER TUMOR WITH              gemcitabine;  Surgeon: Hollice Espy, MD;  Location:               [facility] ORS;  Service: Urology;  Laterality: N/A;  BMI    Body Mass Index: 25.42 kg/m      Reproductive/Obstetrics negative OB ROS                             Anesthesia Physical Anesthesia Plan  ASA: 3  Anesthesia Plan: General   Post-op Pain Management:    Induction: Intravenous  PONV Risk Score and Plan: Dexamethasone, Ondansetron, Midazolam and Treatment may vary due to age or medical condition  Airway Management Planned: LMA  Additional Equipment:   Intra-op Plan:   Post-operative Plan: Extubation in OR  Informed Consent: I have reviewed the patients History and Physical, chart, labs and discussed the procedure including the risks, benefits and alternatives for the proposed anesthesia with the patient or authorized representative who has indicated his/her understanding and acceptance.     Dental Advisory Given  Plan Discussed with: CRNA and Surgeon  Anesthesia Plan Comments:         Anesthesia Quick Evaluation

## 2021-11-14 NOTE — Transfer of Care (Signed)
Immediate Anesthesia Transfer of Care Note  Patient: Susan Medina  Procedure(s) Performed: CYSTOSCOPY (Bilateral) CYSTOSCOPY WITH BLADDER BIOPSY  Patient Location: PACU  Anesthesia Type:General  Level of Consciousness: drowsy  Airway & Oxygen Therapy: Patient Spontanous Breathing and Patient connected to face mask oxygen  Post-op Assessment: Report given to RN and Post -op Vital signs reviewed and stable  Post vital signs: Reviewed and stable  Last Vitals:  Vitals Value Taken Time  BP 130/82 11/14/21 1247  Temp 36.6 C 11/14/21 1247  Pulse 67 11/14/21 1250  Resp 16 11/14/21 1250  SpO2 99 % 11/14/21 1250    Last Pain:  Vitals:   11/14/21 1007  TempSrc: Temporal  PainSc: 0-No pain         Complications: No notable events documented.

## 2021-11-14 NOTE — Op Note (Signed)
Date of procedure: 11/14/21  Preoperative diagnosis:  History of bladder cancer Bladder erythema Atypical urine cytology  Postoperative diagnosis:  Same as above  Procedure: Cystoscopy Attempted bilateral retrograde pyelogram (fluoroscopy failure, unable to complete) Random bladder biopsy x5  Surgeon: Hollice Espy, MD  Anesthesia: General  Complications: None  Intraoperative findings: Bladder trabeculation with scarring on the right trigonal bladder neck area at the previous resection site.  Previous erythematous patches now resolved, no underlying suspicious areas in the bladder.  Unable to perform retrograde pyelogram secondary to fluoroscopy failure.  EBL: Minimal  Specimens: Bladder biopsy, random  Drains: None  Indication: Susan Medina is a 76 y.o. patient with personal history of bladder cancer status post induction BCG shown to have an abnormal urine cytology and bladder erythema suspicious for possible CIS.  After reviewing the management options for treatment, she elected to proceed with the above surgical procedure(s). We have discussed the potential benefits and risks of the procedure, side effects of the proposed treatment, the likelihood of the patient achieving the goals of the procedure, and any potential problems that might occur during the procedure or recuperation. Informed consent has been obtained.  Description of procedure:  The patient was taken to the operating room and general anesthesia was induced.  The patient was placed in the dorsal lithotomy position, prepped and draped in the usual sterile fashion, and preoperative antibiotics were administered. A preoperative time-out was performed.   A 21 French the scope was advanced per urethra to the bladder.  The bladder was carefully inspected.  The previously noted erythematous patches had essentially cleared and there was no discrete areas.  There is multiple stellate scars in the bladder as well as  some trabeculation thickened collagen fibers on the right lateral bladder neck adjacent to the right trigone which is mildly distorted.  Initially, intubated the left UO with a 5 [place] open-ended ureteral catheter with anticipation to perform retrograde pyelogram, however, due to unforeseen circumstances, the fluoroscopy failed to take x-rays.  We attempted multiple maneuvers including device reset unfortunately, were not able to get the fluoroscopy up and running for this procedure.  As such, I did not perform bilateral retrogrades as anticipated.  Next, cold cup biopsy forceps were used to biopsy 5 discrete areas in the bladder.  These were on the dome, posterior bladder wall, lateral bladder walls and right bladder neck.  These were passed off the field as random bladder biopsies.  I then used Bugbee electrocautery and water as the medium to fulgurate these areas for excellent hemostasis.  The bladder was then drained.  She was then cleaned and dried, reversed from anesthesia, and taken to the PACU in stable condition.  Plan: We will plan to review her pathology results in clinic next week.  In lieu of bilateral retrogrades, recommended a CT urogram which was ordered.  Hollice Espy, M.D.

## 2021-11-14 NOTE — Telephone Encounter (Signed)
We were unable to do the retrograde pyelograms in the operating room due to a malfunctioning fluoroscopy unit.  She has an outstanding order for CT urogram, would like for her to proceed with this.  Sharyn Lull, can you please help get this scheduled?  Let me know if I need to reorder.  Hollice Espy, MD

## 2021-11-15 ENCOUNTER — Telehealth: Payer: Self-pay | Admitting: *Deleted

## 2021-11-15 ENCOUNTER — Encounter: Payer: Self-pay | Admitting: Urology

## 2021-11-15 LAB — SURGICAL PATHOLOGY

## 2021-11-15 NOTE — Telephone Encounter (Addendum)
Left VM with details  ----- Message from Hollice Espy, MD sent at 11/15/2021  3:28 PM EST ----- Please let Susan Medina know that her biopsy showed no cancer which is awesome news!  I still want her to get her CT scan as being scheduled and have her follow-up with these results.  Hollice Espy, MD

## 2021-11-18 ENCOUNTER — Ambulatory Visit: Payer: Medicare HMO

## 2021-11-22 ENCOUNTER — Other Ambulatory Visit: Payer: Self-pay

## 2021-11-22 ENCOUNTER — Ambulatory Visit
Admission: RE | Admit: 2021-11-22 | Discharge: 2021-11-22 | Disposition: A | Discharge status: Home / Self Care | Payer: Medicare HMO | Source: Ambulatory Visit | Attending: Urology | Admitting: Urology

## 2021-11-22 DIAGNOSIS — N3289 Other specified disorders of bladder: Secondary | ICD-10-CM | POA: Diagnosis not present

## 2021-11-22 DIAGNOSIS — Z8551 Personal history of malignant neoplasm of bladder: Secondary | ICD-10-CM | POA: Diagnosis not present

## 2021-11-22 DIAGNOSIS — C679 Malignant neoplasm of bladder, unspecified: Secondary | ICD-10-CM | POA: Diagnosis not present

## 2021-11-22 DIAGNOSIS — K5900 Constipation, unspecified: Secondary | ICD-10-CM | POA: Diagnosis not present

## 2021-11-22 DIAGNOSIS — I700 Atherosclerosis of aorta: Secondary | ICD-10-CM | POA: Diagnosis not present

## 2021-11-22 DIAGNOSIS — N281 Cyst of kidney, acquired: Secondary | ICD-10-CM | POA: Diagnosis not present

## 2021-11-22 MED ORDER — IOHEXOL 300 MG/ML  SOLN
100.0000 mL | Freq: Once | INTRAMUSCULAR | Status: AC | PRN
  Administered 2021-11-22: 10:00:00 100 mL via INTRAVENOUS

## 2021-11-29 ENCOUNTER — Ambulatory Visit: Payer: Medicare HMO | Admitting: Urology

## 2021-11-30 ENCOUNTER — Telehealth: Payer: Self-pay | Admitting: Urology

## 2021-11-30 NOTE — Telephone Encounter (Signed)
Please let Susan Medina know that her CT scan looked fine.  She is scheduled to see me in clinic this week but no showed.  Her biopsy was negative for ongoing malignancy which is great news.  I would like to just schedule her follow-up for cystoscopy again in 3 months.  Can you ensure that this is rescheduled and make her aware.  Hollice Espy, MD

## 2021-12-01 NOTE — Telephone Encounter (Signed)
Patient informed, she apologized for missing the appointment. She will be moving to [place] in March. She said she will call to make an appointment to follow up with you before she leaves and to get her records. She appreciates all the care.

## 2021-12-02 ENCOUNTER — Encounter: Payer: Self-pay | Admitting: Urology

## 2021-12-15 DIAGNOSIS — E039 Hypothyroidism, unspecified: Secondary | ICD-10-CM | POA: Diagnosis not present

## 2021-12-15 DIAGNOSIS — C675 Malignant neoplasm of bladder neck: Secondary | ICD-10-CM | POA: Diagnosis not present

## 2021-12-15 DIAGNOSIS — Z1389 Encounter for screening for other disorder: Secondary | ICD-10-CM | POA: Diagnosis not present

## 2021-12-15 DIAGNOSIS — Z0000 Encounter for general adult medical examination without abnormal findings: Secondary | ICD-10-CM | POA: Diagnosis not present

## 2021-12-15 DIAGNOSIS — I10 Essential (primary) hypertension: Secondary | ICD-10-CM | POA: Diagnosis not present

## 2021-12-27 ENCOUNTER — Other Ambulatory Visit: Payer: Medicare HMO | Admitting: Urology

## 2022-03-22 IMAGING — CT CT ABD-PEL WO/W CM
3 of 12 series · 11 of 46 positions shown, 17 images · IV contrast (agent unspecified)
Comparison: 04/21/2020.

CLINICAL DATA: Bladder cancer, status post TURBT.

EXAM:
CT ABDOMEN AND PELVIS WITHOUT AND WITH CONTRAST
TECHNIQUE: Multidetector CT imaging of the abdomen and pelvis was performed
following the standard protocol before and following the bolus
administration of intravenous contrast.

[Series 2: abd without pre 5.00 · axial · non-contrast · 0.63mm/px · z∈[-1438,-1378]mm · 2 of 87 slices shown]
[im 13/87  soft-tissue]
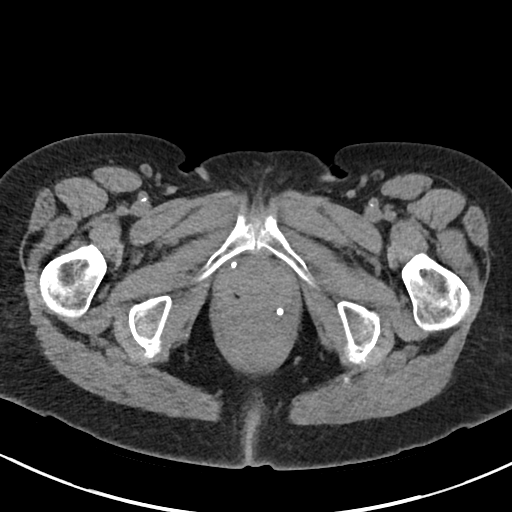
[im 25/87  soft-tissue]
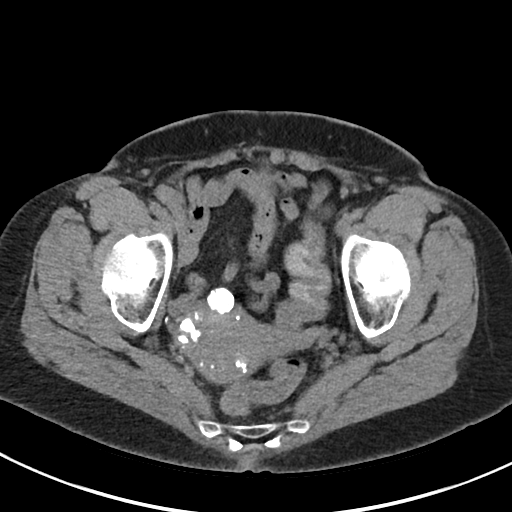

[Series 5: cor without without pre 2.00 cor · coronal · non-contrast · 0.63mm/px · 2 of 137 slices shown, 3 images]
[im 46/137  soft-tissue]
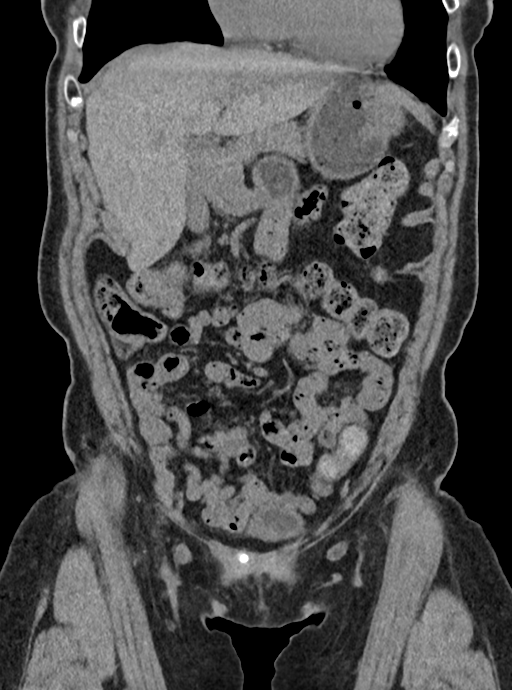
[im 46/137  bone]
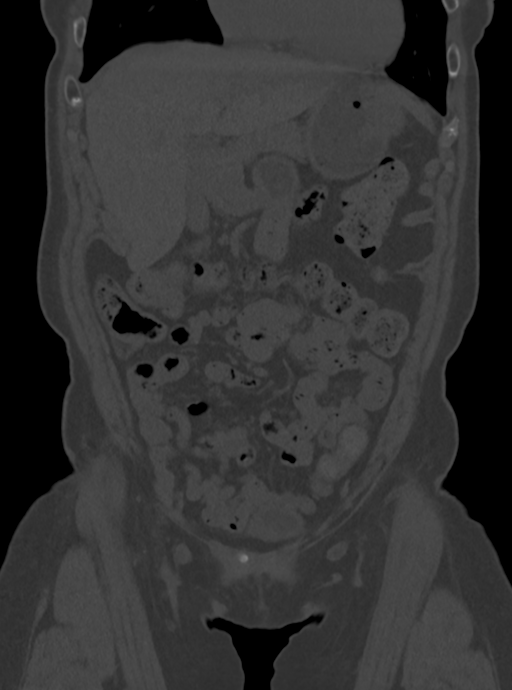
[im 91/137  soft-tissue]
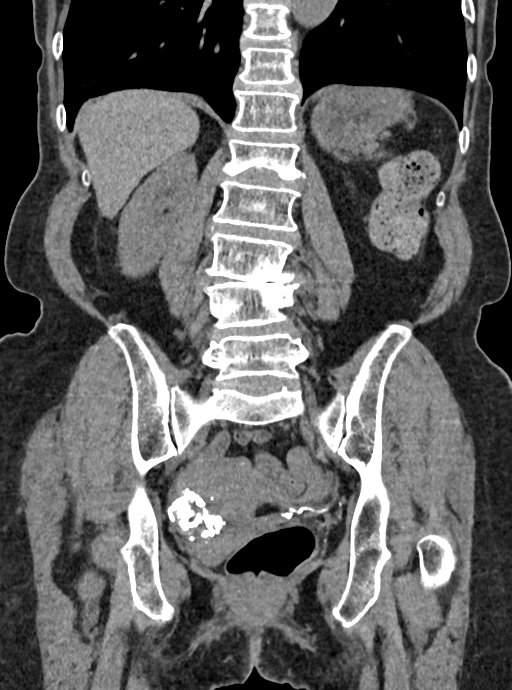

[Series 17: axial delay delay prone 5.00 · axial · delayed · 0.63mm/px · z∈[-1585,-1240]mm · 7 of 93 slices shown, 12 images]
[im 12/93  soft-tissue]
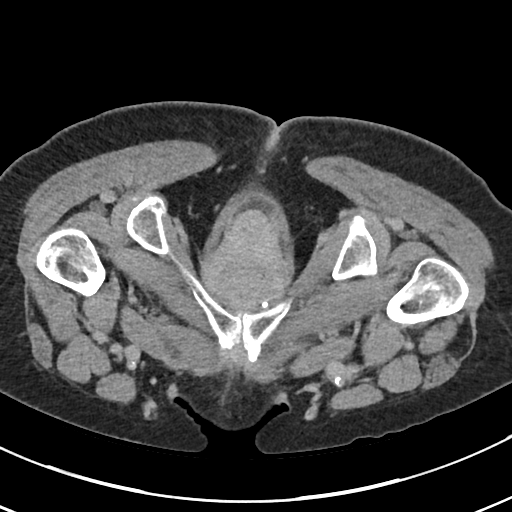
[im 12/93  bone]
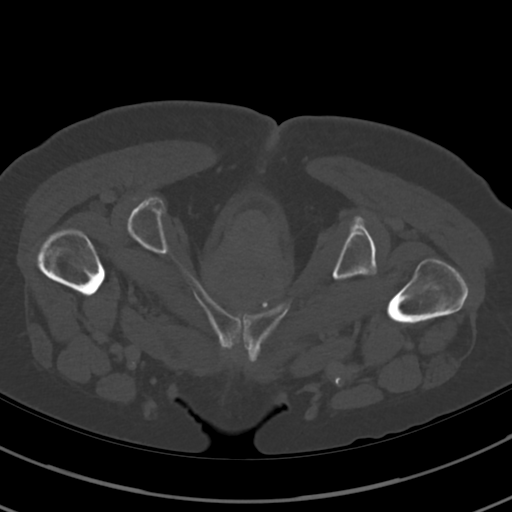
[im 24/93  soft-tissue]
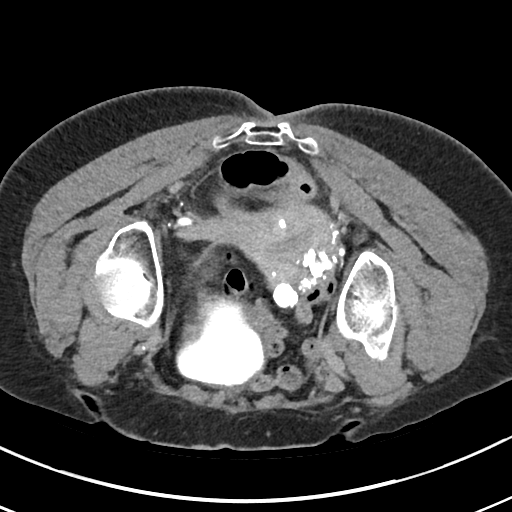
[im 35/93  soft-tissue]
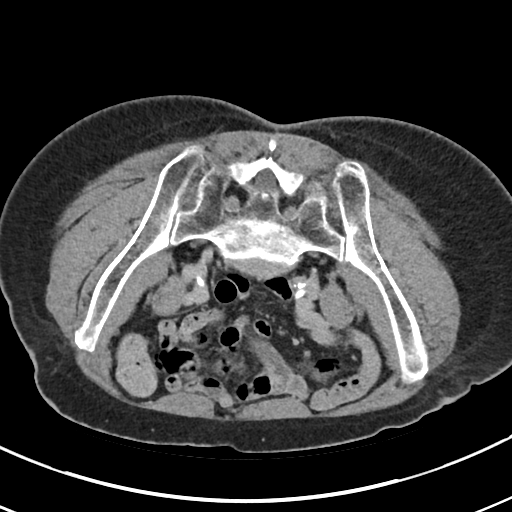
[im 47/93  soft-tissue]
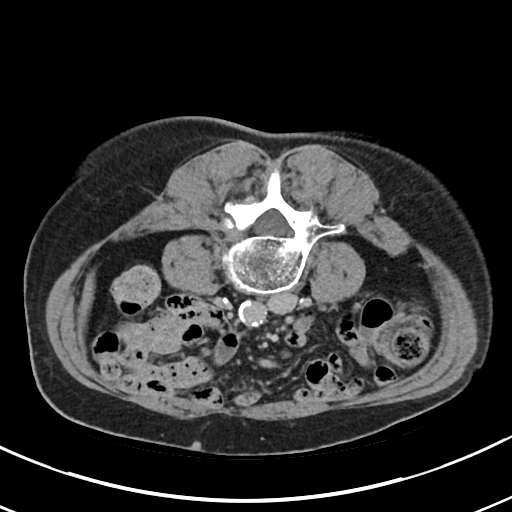
[im 47/93  lung]
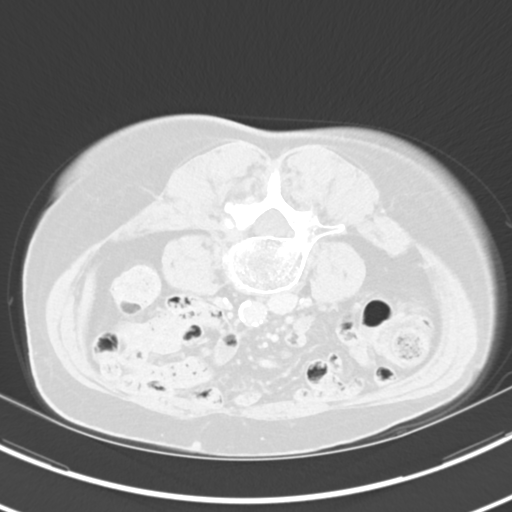
[im 58/93  soft-tissue]
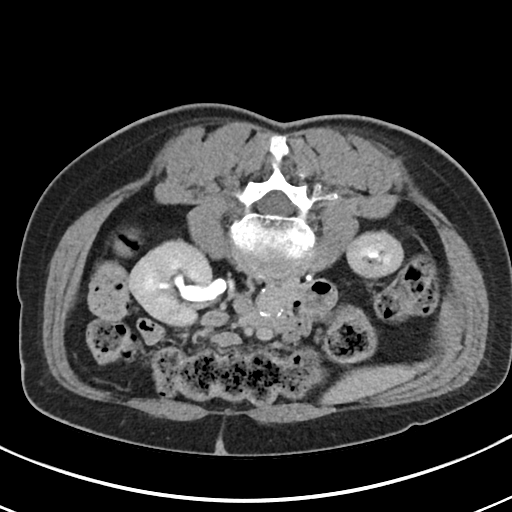
[im 58/93  lung]
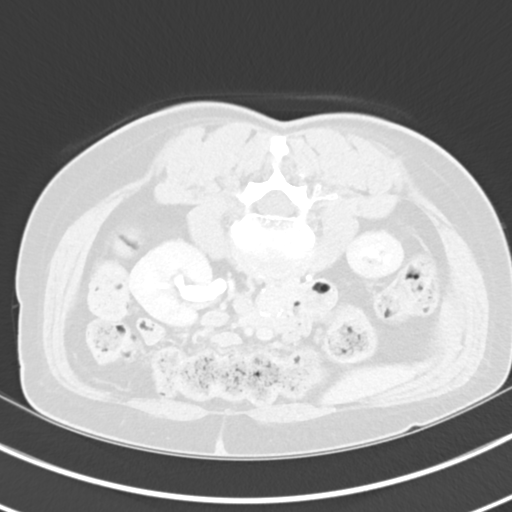
[im 70/93  soft-tissue]
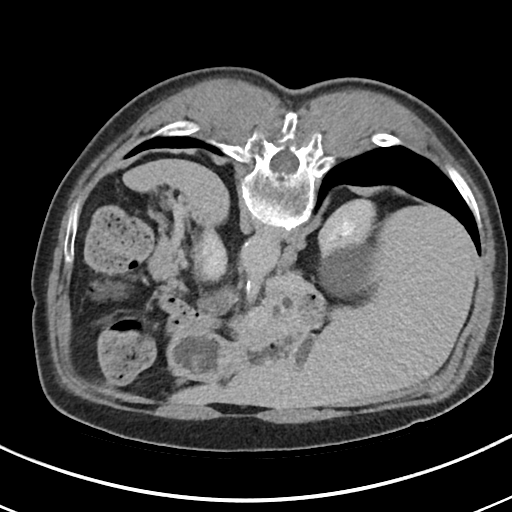
[im 70/93  lung]
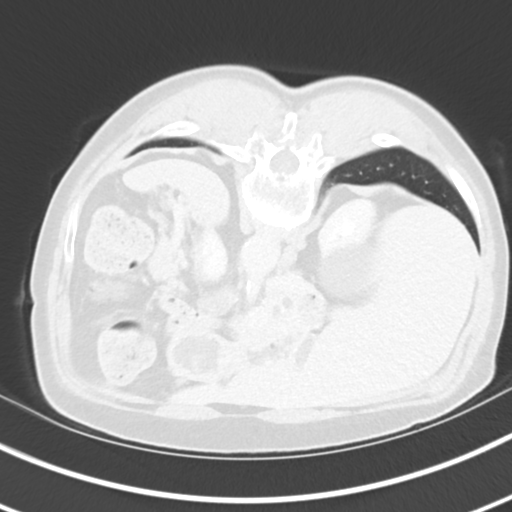
[im 81/93  soft-tissue]
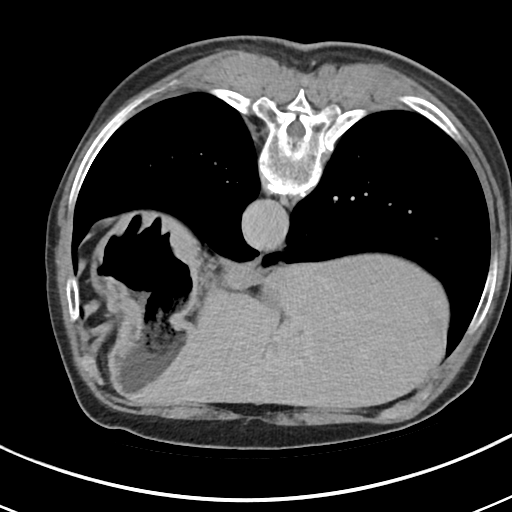
[im 81/93  lung]
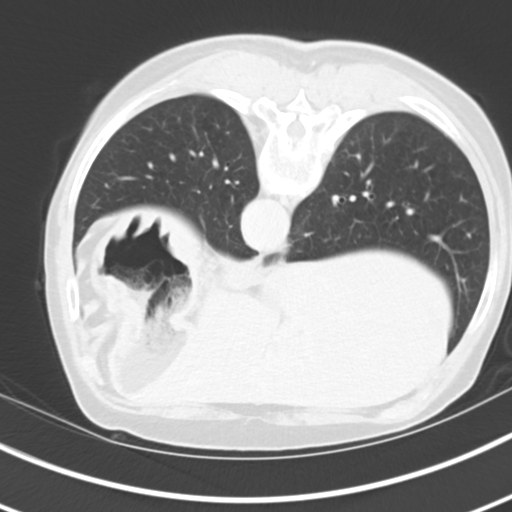

[11 of 46 positions shown; findings below may reference images not displayed]

RADIATION DOSE REDUCTION: This exam was performed according to the
departmental dose-optimization program which includes automated
exposure control, adjustment of the mA and/or kV according to
patient size and/or use of iterative reconstruction technique.

CONTRAST:  100mL OMNIPAQUE IOHEXOL 300 MG/ML  SOLN
FINDINGS: Lower chest: 4 mm subpleural right middle lobe nodule ([DATE]), new
from 04/21/2020. Lung bases are otherwise clear. Heart size normal.
No pericardial or pleural effusion. Distal esophagus is grossly
unremarkable.

Hepatobiliary: Mildly hypodense but enhancing lesion in the central
left hepatic lobe measures 2.2 x 2.7 cm, stable in size from
04/21/2020 and therefore likely benign. Liver and gallbladder are
otherwise unremarkable. No biliary ductal dilatation.

Pancreas: Negative.

Spleen: Negative.

Adrenals/Urinary Tract: Right adrenal gland is unremarkable. Fluid
density left adrenal nodule measures 1.3 x 1.8 cm. No urinary
stones. 4.8 x 4.8 cm cyst in the right kidney. Subcentimeter
low-attenuation lesions in the kidneys are too small to
characterize. 1.6 cm cyst in the lower pole left kidney. Distal
right ureter is poorly opacified, limiting evaluation. Otherwise, no
filling defects in the opacified portions of the intrarenal
collecting systems or ureters. Areas of bladder wall thickening.

Stomach/Bowel: Stomach and small bowel are unremarkable. Appendix is
not readily visualized. Fair amount of stool in the colon is
indicative of constipation.

Vascular/Lymphatic: Atherosclerotic calcification of the aorta. No
pathologically enlarged lymph nodes.

Reproductive: Calcified lesions in the uterus are likely fibroids.
No adnexal mass.

Other: No free fluid. Mesenteries and peritoneum are otherwise
unremarkable. Tiny midline supraumbilical hernia contains fat.

Musculoskeletal: Degenerative changes in the spine. Mild
dextroconvex scoliosis. L1 compression fracture is old.
IMPRESSION: 1. Areas of bladder wall thickening. Distal right ureter is poorly
opacified, limiting evaluation.
2. 4 mm subpleural right middle lobe nodule is new. Recommend
attention on follow-up.
3. Left adrenal adenoma.
4.  Aortic atherosclerosis ([id number]).

## 2022-06-23 DIAGNOSIS — G8929 Other chronic pain: Secondary | ICD-10-CM | POA: Diagnosis not present

## 2022-06-23 DIAGNOSIS — M545 Low back pain, unspecified: Secondary | ICD-10-CM | POA: Diagnosis not present

## 2022-06-23 DIAGNOSIS — I10 Essential (primary) hypertension: Secondary | ICD-10-CM | POA: Diagnosis not present

## 2022-06-23 DIAGNOSIS — E039 Hypothyroidism, unspecified: Secondary | ICD-10-CM | POA: Diagnosis not present

## 2022-09-15 DIAGNOSIS — R519 Headache, unspecified: Secondary | ICD-10-CM | POA: Diagnosis not present

## 2022-09-15 DIAGNOSIS — H109 Unspecified conjunctivitis: Secondary | ICD-10-CM | POA: Diagnosis not present

## 2022-09-15 DIAGNOSIS — J069 Acute upper respiratory infection, unspecified: Secondary | ICD-10-CM | POA: Diagnosis not present

## 2022-09-15 DIAGNOSIS — R051 Acute cough: Secondary | ICD-10-CM | POA: Diagnosis not present
